# Patient Record
Sex: Female | Born: 1990 | Race: White | Hispanic: No | Marital: Married | Smoking: Former smoker
Health system: Southern US, Community
[De-identification: ages and names within clinical notes are randomized; demographics above are authoritative.]

## PROBLEM LIST (undated history)

## (undated) ENCOUNTER — Inpatient Hospital Stay: Payer: Self-pay

## (undated) DIAGNOSIS — R12 Heartburn: Secondary | ICD-10-CM

## (undated) DIAGNOSIS — F419 Anxiety disorder, unspecified: Secondary | ICD-10-CM

## (undated) HISTORY — PX: WISDOM TOOTH EXTRACTION: SHX21

## (undated) HISTORY — DX: Heartburn: R12

## (undated) HISTORY — DX: Anxiety disorder, unspecified: F41.9

---

## 2015-02-14 ENCOUNTER — Emergency Department: Payer: Self-pay | Admitting: Emergency Medicine

## 2015-03-17 ENCOUNTER — Emergency Department: Admit: 2015-03-17 | Disposition: A | Discharge status: Home / Self Care | Payer: Self-pay | Admitting: Family Medicine

## 2016-11-04 ENCOUNTER — Ambulatory Visit (INDEPENDENT_AMBULATORY_CARE_PROVIDER_SITE_OTHER): Payer: Medicaid Other | Admitting: Obstetrics and Gynecology

## 2016-11-04 ENCOUNTER — Other Ambulatory Visit: Payer: Self-pay | Admitting: Obstetrics and Gynecology

## 2016-11-04 ENCOUNTER — Ambulatory Visit (INDEPENDENT_AMBULATORY_CARE_PROVIDER_SITE_OTHER): Payer: Medicaid Other

## 2016-11-04 ENCOUNTER — Encounter: Payer: Self-pay | Admitting: Obstetrics and Gynecology

## 2016-11-04 VITALS — BP 132/89 | HR 88 | Ht 66.0 in | Wt 219.0 lb

## 2016-11-04 DIAGNOSIS — G43909 Migraine, unspecified, not intractable, without status migrainosus: Secondary | ICD-10-CM | POA: Insufficient documentation

## 2016-11-04 DIAGNOSIS — N926 Irregular menstruation, unspecified: Secondary | ICD-10-CM | POA: Insufficient documentation

## 2016-11-04 DIAGNOSIS — Z3402 Encounter for supervision of normal first pregnancy, second trimester: Secondary | ICD-10-CM | POA: Diagnosis not present

## 2016-11-04 DIAGNOSIS — Z369 Encounter for antenatal screening, unspecified: Secondary | ICD-10-CM | POA: Diagnosis not present

## 2016-11-04 DIAGNOSIS — J302 Other seasonal allergic rhinitis: Secondary | ICD-10-CM | POA: Insufficient documentation

## 2016-11-04 DIAGNOSIS — Z113 Encounter for screening for infections with a predominantly sexual mode of transmission: Secondary | ICD-10-CM

## 2016-11-04 DIAGNOSIS — Z8709 Personal history of other diseases of the respiratory system: Secondary | ICD-10-CM | POA: Insufficient documentation

## 2016-11-04 DIAGNOSIS — E669 Obesity, unspecified: Secondary | ICD-10-CM

## 2016-11-04 DIAGNOSIS — N912 Amenorrhea, unspecified: Secondary | ICD-10-CM | POA: Insufficient documentation

## 2016-11-04 DIAGNOSIS — Z1389 Encounter for screening for other disorder: Secondary | ICD-10-CM

## 2016-11-04 LAB — OB RESULTS CONSOLE VARICELLA ZOSTER ANTIBODY, IGG: VARICELLA IGG: IMMUNE

## 2016-11-04 NOTE — Progress Notes (Signed)
NEW OB HISTORY AND PHYSICAL  SUBJECTIVE:       Marcia AbbottRebecca Morris is a 25 y.o. G1P0 female, Patient's last menstrual period was 07/24/2016 (exact date)., Estimated Date of Delivery: 04/30/17, [id number], presents today for establishment of Prenatal Care. She has no unusual complaints and complains of headache and nausea without vomiting for intermittent days      Gynecologic History Patient's last menstrual period was 07/24/2016 (exact date). Abnormal Contraception: none Last Pap: 2011. Results were: normal  Obstetric History OB History  Gravida Para Term Preterm AB Living  1            SAB TAB Ectopic Multiple Live Births               # Outcome Date GA Lbr Len/2nd Weight Sex Delivery Anes PTL Lv  1 Current               History reviewed. No pertinent past medical history.  Past Surgical History:  Procedure Laterality Date  . WISDOM TOOTH EXTRACTION      No current outpatient prescriptions on file prior to visit.   No current facility-administered medications on file prior to visit.     No Known Allergies  Social History   Social History  . Marital status: Married    Spouse name: N/A  . Number of children: N/A  . Years of education: N/A   Occupational History  . Not on file.   Social History Main Topics  . Smoking status: Former Smoker    Quit date: 11/24/2014  . Smokeless tobacco: Never Used  . Alcohol use No  . Drug use: No  . Sexual activity: Yes    Partners: Male   Other Topics Concern  . Not on file   Social History Narrative  . No narrative on file    Family History  Problem Relation Age of Onset  . Hypertension Mother   . Cancer Mother     SKIN CANCER  . Hypertension Father   . Cancer Father     PROSTATE CANCER  . Seizures Brother   . Cancer Maternal Grandmother     BRAIN TUMOR    The following portions of the patient's history were reviewed and updated as appropriate: allergies, current medications, past OB history, past medical  history, past surgical history, past family history, past social history, and problem list.    OBJECTIVE: Initial Physical Exam (New OB)  GENERAL APPEARANCE: alert, well appearing, in no apparent distress, oriented to person, place and time, overweight HEAD: normocephalic, atraumatic MOUTH: mucous membranes moist, pharynx normal without lesions THYROID: no thyromegaly or masses present BREASTS: not examined LUNGS: clear to auscultation, no wheezes, rales or rhonchi, symmetric air entry HEART: regular rate and rhythm, no murmurs ABDOMEN: soft, nontender, nondistended, no abnormal masses, no epigastric pain, fundus soft, nontender 14 weeks size and FHT present EXTREMITIES: no redness or tenderness in the calves or thighs SKIN: normal coloration and turgor, no rashes LYMPH NODES: no adenopathy palpable NEUROLOGIC: alert, oriented, normal speech, no focal findings or movement disorder noted  PELVIC EXAM not indicated  ASSESSMENT: Normal pregnancy Elevated BMI Irregular LMP  PLAN: Prenatal care Viability and dating scan done today confirms EDD Declined flu vaccine See orders

## 2016-11-04 NOTE — Patient Instructions (Signed)
Second Trimester of Pregnancy The second trimester is from week 13 through week 28 (months 4 through 6). The second trimester is often a time when you feel your best. Your body has also adjusted to being pregnant, and you begin to feel better physically. Usually, morning sickness has lessened or quit completely, you may have more energy, and you may have an increase in appetite. The second trimester is also a time when the fetus is growing rapidly. At the end of the sixth month, the fetus is about 9 inches long and weighs about 1 pounds. You will likely begin to feel the baby move (quickening) between 18 and 20 weeks of the pregnancy. Body changes during your second trimester Your body continues to go through many changes during your second trimester. The changes vary from woman to woman.  Your weight will continue to increase. You will notice your lower abdomen bulging out.  You may begin to get stretch marks on your hips, abdomen, and breasts.  You may develop headaches that can be relieved by medicines. The medicines should be approved by your health care provider.  You may urinate more often because the fetus is pressing on your bladder.  You may develop or continue to have heartburn as a result of your pregnancy.  You may develop constipation because certain hormones are causing the muscles that push waste through your intestines to slow down.  You may develop hemorrhoids or swollen, bulging veins (varicose veins).  You may have back pain. This is caused by:  Weight gain.  Pregnancy hormones that are relaxing the joints in your pelvis.  A shift in weight and the muscles that support your balance.  Your breasts will continue to grow and they will continue to become tender.  Your gums may bleed and may be sensitive to brushing and flossing.  Dark spots or blotches (chloasma, mask of pregnancy) may develop on your face. This will likely fade after the baby is born.  A dark line  from your belly button to the pubic area (linea nigra) may appear. This will likely fade after the baby is born.  You may have changes in your hair. These can include thickening of your hair, rapid growth, and changes in texture. Some women also have hair loss during or after pregnancy, or hair that feels dry or thin. Your hair will most likely return to normal after your baby is born. What to expect at prenatal visits During a routine prenatal visit:  You will be weighed to make sure you and the fetus are growing normally.  Your blood pressure will be taken.  Your abdomen will be measured to track your baby's growth.  The fetal heartbeat will be listened to.  Any test results from the previous visit will be discussed. Your health care provider may ask you:  How you are feeling.  If you are feeling the baby move.  If you have had any abnormal symptoms, such as leaking fluid, bleeding, severe headaches, or abdominal cramping.  If you are using any tobacco products, including cigarettes, chewing tobacco, and electronic cigarettes.  If you have any questions. Other tests that may be performed during your second trimester include:  Blood tests that check for:  Low iron levels (anemia).  Gestational diabetes (between 24 and 28 weeks).  Rh antibodies. This is to check for a protein on red blood cells (Rh factor).  Urine tests to check for infections, diabetes, or protein in the urine.  An ultrasound to   confirm the proper growth and development of the baby.  An amniocentesis to check for possible genetic problems.  Fetal screens for spina bifida and Down syndrome.  HIV (human immunodeficiency virus) testing. Routine prenatal testing includes screening for HIV, unless you choose not to have this test. Follow these instructions at home: Eating and drinking  Continue to eat regular, healthy meals.  Avoid raw meat, uncooked cheese, cat litter boxes, and soil used by cats. These  carry germs that can cause birth defects in the baby.  Take your prenatal vitamins.  Take 1500-2000 mg of calcium daily starting at the 20th week of pregnancy until you deliver your baby.  If you develop constipation:  Take over-the-counter or prescription medicines.  Drink enough fluid to keep your urine clear or pale yellow.  Eat foods that are high in fiber, such as fresh fruits and vegetables, whole grains, and beans.  Limit foods that are high in fat and processed sugars, such as fried and sweet foods. Activity  Exercise only as directed by your health care provider. Experiencing uterine cramps is a good sign to stop exercising.  Avoid heavy lifting, wear low heel shoes, and practice good posture.  Wear your seat belt at all times when driving.  Rest with your legs elevated if you have leg cramps or low back pain.  Wear a good support bra for breast tenderness.  Do not use hot tubs, steam rooms, or saunas. Lifestyle  Avoid all smoking, herbs, alcohol, and unprescribed drugs. These chemicals affect the formation and growth of the baby.  Do not use any products that contain nicotine or tobacco, such as cigarettes and e-cigarettes. If you need help quitting, ask your health care provider.  A sexual relationship may be continued unless your health care provider directs you otherwise. General instructions  Follow your health care provider's instructions regarding medicine use. There are medicines that are either safe or unsafe to take during pregnancy.  Take warm sitz baths to soothe any pain or discomfort caused by hemorrhoids. Use hemorrhoid cream if your health care provider approves.  If you develop varicose veins, wear support hose. Elevate your feet for 15 minutes, 3-4 times a day. Limit salt in your diet.  Visit your dentist if you have not gone yet during your pregnancy. Use a soft toothbrush to brush your teeth and be gentle when you floss.  Keep all follow-up  prenatal visits as told by your health care provider. This is important. Contact a health care provider if:  You have dizziness.  You have mild pelvic cramps, pelvic pressure, or nagging pain in the abdominal area.  You have persistent nausea, vomiting, or diarrhea.  You have a bad smelling vaginal discharge.  You have pain with urination. Get help right away if:  You have a fever.  You are leaking fluid from your vagina.  You have spotting or bleeding from your vagina.  You have severe abdominal cramping or pain.  You have rapid weight gain or weight loss.  You have shortness of breath with chest pain.  You notice sudden or extreme swelling of your face, hands, ankles, feet, or legs.  You have not felt your baby move in over an hour.  You have severe headaches that do not go away with medicine.  You have vision changes. Summary  The second trimester is from week 13 through week 28 (months 4 through 6). It is also a time when the fetus is growing rapidly.  Your body goes   through many changes during pregnancy. The changes vary from woman to woman.  Avoid all smoking, herbs, alcohol, and unprescribed drugs. These chemicals affect the formation and growth your baby.  Do not use any tobacco products, such as cigarettes, chewing tobacco, and e-cigarettes. If you need help quitting, ask your health care provider.  Contact your health care provider if you have any questions. Keep all prenatal visits as told by your health care provider. This is important. This information is not intended to replace advice given to you by your health care provider. Make sure you discuss any questions you have with your health care provider. Document Released: 11/11/2001 Document Revised: 04/24/2016 Document Reviewed: 01/18/2013 Elsevier Interactive Patient Education  2017 [employer]  

## 2016-11-05 LAB — CBC WITH DIFFERENTIAL/PLATELET
BASOS: 0 %
Basophils Absolute: 0 10*3/uL (ref 0.0–0.2)
EOS (ABSOLUTE): 0.1 10*3/uL (ref 0.0–0.4)
EOS: 1 %
HEMATOCRIT: 37.5 % (ref 34.0–46.6)
HEMOGLOBIN: 12.7 g/dL (ref 11.1–15.9)
IMMATURE GRANULOCYTES: 0 %
Immature Grans (Abs): 0 10*3/uL (ref 0.0–0.1)
Lymphocytes Absolute: 2.6 10*3/uL (ref 0.7–3.1)
Lymphs: 28 %
MCH: 30 pg (ref 26.6–33.0)
MCHC: 33.9 g/dL (ref 31.5–35.7)
MCV: 89 fL (ref 79–97)
MONOCYTES: 7 %
Monocytes Absolute: 0.7 10*3/uL (ref 0.1–0.9)
NEUTROS PCT: 64 %
Neutrophils Absolute: 6 10*3/uL (ref 1.4–7.0)
Platelets: 246 10*3/uL (ref 150–379)
RBC: 4.23 x10E6/uL (ref 3.77–5.28)
RDW: 13.4 % (ref 12.3–15.4)
WBC: 9.5 10*3/uL (ref 3.4–10.8)

## 2016-11-05 LAB — VARICELLA ZOSTER ANTIBODY, IGG: Varicella zoster IgG: 818 index (ref >165)

## 2016-11-05 LAB — HEPATITIS B SURFACE ANTIGEN: HEP B S AG: NEGATIVE

## 2016-11-05 LAB — ABO

## 2016-11-05 LAB — HEMOGLOBIN A1C
ESTIMATED AVERAGE GLUCOSE: 97 mg/dL
Hgb A1c MFr Bld: 5 % (ref 4.8–5.6)

## 2016-11-05 LAB — RPR: RPR: NONREACTIVE

## 2016-11-05 LAB — HIV ANTIBODY (ROUTINE TESTING W REFLEX): HIV SCREEN 4TH GENERATION: NONREACTIVE

## 2016-11-05 LAB — TSH: TSH: 1.81 u[IU]/mL (ref 0.450–4.500)

## 2016-11-05 LAB — RH TYPE: Rh Factor: NEGATIVE

## 2016-11-05 LAB — RUBELLA SCREEN: Rubella Antibodies, IGG: 3.48 index (ref >0.99)

## 2016-11-05 LAB — SICKLE CELL SCREEN: SICKLE CELL SCREEN: NEGATIVE

## 2016-11-05 LAB — ANTIBODY SCREEN: Antibody Screen: NEGATIVE

## 2016-11-06 ENCOUNTER — Other Ambulatory Visit: Payer: Self-pay | Admitting: Obstetrics and Gynecology

## 2016-11-06 LAB — URINALYSIS, ROUTINE W REFLEX MICROSCOPIC
Bilirubin, UA: NEGATIVE
Glucose, UA: NEGATIVE
LEUKOCYTES UA: NEGATIVE
Nitrite, UA: NEGATIVE
RBC, UA: NEGATIVE
Urobilinogen, Ur: 0.2 mg/dL (ref 0.2–1.0)
pH, UA: 6.5 (ref 5.0–7.5)

## 2016-11-06 LAB — NICOTINE SCREEN, URINE: Cotinine Ql Scrn, Ur: NEGATIVE ng/mL

## 2016-11-06 LAB — MONITOR DRUG PROFILE 14(MW)
AMPHETAMINE SCREEN URINE: NEGATIVE ng/mL
BARBITURATE SCREEN URINE: NEGATIVE ng/mL
BENZODIAZEPINE SCREEN, URINE: NEGATIVE ng/mL
Buprenorphine, Urine: NEGATIVE ng/mL
CANNABINOIDS UR QL SCN: NEGATIVE ng/mL
Cocaine (Metab) Scrn, Ur: NEGATIVE ng/mL
Creatinine(Crt), U: 274.1 mg/dL (ref 20.0–300.0)
Fentanyl, Urine: NEGATIVE pg/mL
Meperidine Screen, Urine: NEGATIVE ng/mL
Methadone Screen, Urine: NEGATIVE ng/mL
OXYCODONE+OXYMORPHONE UR QL SCN: NEGATIVE ng/mL
Opiate Scrn, Ur: NEGATIVE ng/mL
PH UR, DRUG SCRN: 6.2 (ref 4.5–8.9)
PHENCYCLIDINE QUANTITATIVE URINE: NEGATIVE ng/mL
PROPOXYPHENE SCREEN URINE: NEGATIVE ng/mL
SPECIFIC GRAVITY: 1.03
Tramadol Screen, Urine: NEGATIVE ng/mL

## 2016-11-06 LAB — GC/CHLAMYDIA PROBE AMP
CHLAMYDIA, DNA PROBE: NEGATIVE
NEISSERIA GONORRHOEAE BY PCR: NEGATIVE

## 2016-11-06 MED ORDER — NITROFURANTOIN MONOHYD MACRO 100 MG PO CAPS: 100.0000 mg | ORAL_CAPSULE | Freq: Two times a day (BID) | ORAL | 0 refills | Status: DC

## 2016-11-07 ENCOUNTER — Telehealth: Payer: Self-pay | Admitting: *Deleted

## 2016-11-07 NOTE — Telephone Encounter (Signed)
-----   Message from Purcell NailsMelody N [place], [place] sent at 11/06/2016  5:36 PM EST ----- Please let her know she has a UTI, I sent in an antibiotic

## 2016-11-07 NOTE — Telephone Encounter (Signed)
Notified pt of results 

## 2016-11-08 LAB — URINE CULTURE, OB REFLEX

## 2016-11-08 LAB — CULTURE, OB URINE

## 2016-11-11 ENCOUNTER — Other Ambulatory Visit: Payer: Self-pay | Admitting: Obstetrics and Gynecology

## 2016-11-11 MED ORDER — FLUCONAZOLE 100 MG PO TABS: 150.0000 mg | ORAL_TABLET | ORAL | 1 refills | Status: AC

## 2016-12-01 NOTE — L&D Delivery Note (Signed)
Delivery Note  1115 In room to assess patient, pushing resumed at 1050. SVE: 10/100/+3, vertex. Effective maternal pushing efforts in side lying position.   1210 Spontaneous vaginal birth of live born female infant in vertex presentation in left occiput anterior position. Loose nuchal x 1 reduced via somersaulting technique to the inner right maternal thigh. Infant immediately to maternal abdomen. Receiving nurse at bedside for delivery. Delayed cord clamping. Cord blood obtained. APGARs: 8, 9. Weight: 6 pounds +15 ounces.   Spontaneous delivery of placenta at 1215. Bilateral labial lacerations and perineal skid mark repaired with 3-0 vicryl rapide under epidural and local anesthesia.   Uterus firm. EBL: 300 ml. Vault check completed. Counts correct x 2. Pitocin infusing.  Mom to postpartum.  Baby to Couplet care / Skin to Skin.  Initiate routine postpartum care and orders. Reassess as needed.   [name] 05/03/2017, 1:06 PM  Dr. Valentino Saxonherry present and readily available for delivery.

## 2016-12-05 ENCOUNTER — Other Ambulatory Visit: Payer: Self-pay | Admitting: Obstetrics and Gynecology

## 2016-12-05 DIAGNOSIS — Z369 Encounter for antenatal screening, unspecified: Secondary | ICD-10-CM

## 2016-12-09 ENCOUNTER — Ambulatory Visit (INDEPENDENT_AMBULATORY_CARE_PROVIDER_SITE_OTHER): Payer: Medicaid Other | Admitting: Obstetrics and Gynecology

## 2016-12-09 ENCOUNTER — Ambulatory Visit (INDEPENDENT_AMBULATORY_CARE_PROVIDER_SITE_OTHER): Payer: Medicaid Other

## 2016-12-09 VITALS — BP 140/84 | HR 92 | Wt 224.1 lb

## 2016-12-09 DIAGNOSIS — Z3402 Encounter for supervision of normal first pregnancy, second trimester: Secondary | ICD-10-CM

## 2016-12-09 DIAGNOSIS — Z369 Encounter for antenatal screening, unspecified: Secondary | ICD-10-CM

## 2016-12-09 DIAGNOSIS — Z3492 Encounter for supervision of normal pregnancy, unspecified, second trimester: Secondary | ICD-10-CM

## 2016-12-09 LAB — POCT URINALYSIS DIPSTICK
Bilirubin, UA: NEGATIVE
Blood, UA: NEGATIVE
Glucose, UA: NEGATIVE
KETONES UA: NEGATIVE
Leukocytes, UA: NEGATIVE
Nitrite, UA: NEGATIVE
PROTEIN UA: NEGATIVE
SPEC GRAV UA: 1.01
Urobilinogen, UA: 0.2
pH, UA: 6.5

## 2016-12-09 NOTE — Progress Notes (Signed)
ROB-anatomy scan done today, she is doing well 

## 2016-12-09 NOTE — Progress Notes (Signed)
ROB & Anatomy scan;discussed enrolling in classes;   Indications:Anatomy U/S Findings:  Singleton intrauterine pregnancy is visualized with FHR at 152 BPM. Biometrics give an (U/S) Gestational age of 27 5/7 weeks and an (U/S) EDD of 04/30/16; this correlates with the clinically established EDD of 04/30/17.  Fetal presentation is Vertex.  EFW: 296g (10 oz). Placenta: Anterior, grade 0, 6.2 cm from internal os AFI: Adequate with MVP of 4.8 cm..  Anatomic survey is incomplete and normal; Gender - female  . Spine, Ductal and Aortic arch is suboptimal due to fetal position.  Ovaries are not seen. Survey of the adnexa demonstrates no adnexal masses. There is no free peritoneal fluid in the cul de sac.  Impression: 1. 19 5/7 week Viable Singleton Intrauterine pregnancy by U/S. 2. (U/S) EDD is consistent with Clinically established (LMP) EDD of . 3. Spine, Ductal and Aortic arch is suboptimal due to fetal position.

## 2017-01-01 ENCOUNTER — Ambulatory Visit (INDEPENDENT_AMBULATORY_CARE_PROVIDER_SITE_OTHER): Payer: Medicaid Other | Admitting: Obstetrics and Gynecology

## 2017-01-01 ENCOUNTER — Ambulatory Visit (INDEPENDENT_AMBULATORY_CARE_PROVIDER_SITE_OTHER): Payer: Medicaid Other

## 2017-01-01 VITALS — BP 128/69 | HR 73 | Wt 223.5 lb

## 2017-01-01 DIAGNOSIS — Z3492 Encounter for supervision of normal pregnancy, unspecified, second trimester: Secondary | ICD-10-CM | POA: Diagnosis not present

## 2017-01-01 DIAGNOSIS — F439 Reaction to severe stress, unspecified: Secondary | ICD-10-CM

## 2017-01-01 LAB — POCT URINALYSIS DIPSTICK
Bilirubin, UA: NEGATIVE
Blood, UA: NEGATIVE
Glucose, UA: NEGATIVE
Ketones, UA: NEGATIVE
Leukocytes, UA: NEGATIVE
Nitrite, UA: NEGATIVE
Protein, UA: NEGATIVE
Spec Grav, UA: 1.015
Urobilinogen, UA: 0.2
pH, UA: 6.5

## 2017-01-01 NOTE — Progress Notes (Signed)
ROB- reviewed normal scan to complete anatomy scan, reports concerns about stress in pregnancy and possible effects. States increased stress at home due to spouses job and lack of pay, has went to work and reports financial stress, lack of support and communication with spouse, they didn't want children ever, and feelings of guilt associated with that. Easily aggitated and tearful.  Not exercising anymore and no known stress relief techniques.  Referred for counseling, recommend exercise and finding other forms of stress relief.

## 2017-01-01 NOTE — Progress Notes (Signed)
ROB- pt had follow-up anatomy scan done today, otherwise she is doing well

## 2017-01-05 ENCOUNTER — Encounter: Payer: Self-pay | Admitting: Obstetrics and Gynecology

## 2017-02-05 ENCOUNTER — Other Ambulatory Visit: Payer: Medicaid Other

## 2017-02-05 ENCOUNTER — Encounter: Payer: Medicaid Other | Admitting: Certified Nurse Midwife

## 2017-02-06 ENCOUNTER — Ambulatory Visit (INDEPENDENT_AMBULATORY_CARE_PROVIDER_SITE_OTHER): Payer: Medicaid Other | Admitting: Certified Nurse Midwife

## 2017-02-06 ENCOUNTER — Encounter: Payer: Self-pay | Admitting: Certified Nurse Midwife

## 2017-02-06 ENCOUNTER — Other Ambulatory Visit: Payer: Medicaid Other

## 2017-02-06 VITALS — BP 130/7 | HR 83 | Wt 226.2 lb

## 2017-02-06 DIAGNOSIS — Z3403 Encounter for supervision of normal first pregnancy, third trimester: Secondary | ICD-10-CM

## 2017-02-06 DIAGNOSIS — Z130 Encounter for screening for diseases of the blood and blood-forming organs and certain disorders involving the immune mechanism: Secondary | ICD-10-CM | POA: Diagnosis not present

## 2017-02-06 DIAGNOSIS — Z131 Encounter for screening for diabetes mellitus: Secondary | ICD-10-CM

## 2017-02-06 DIAGNOSIS — Z23 Encounter for immunization: Secondary | ICD-10-CM

## 2017-02-06 LAB — POCT URINALYSIS DIPSTICK
Bilirubin, UA: NEGATIVE
GLUCOSE UA: NEGATIVE
KETONES UA: NEGATIVE
Leukocytes, UA: NEGATIVE
Nitrite, UA: NEGATIVE
Protein, UA: NEGATIVE
RBC UA: NEGATIVE
SPEC GRAV UA: 1.02
UROBILINOGEN UA: NEGATIVE
pH, UA: 6.5

## 2017-02-06 MED ORDER — TETANUS-DIPHTH-ACELL PERTUSSIS 5-2.5-18.5 LF-MCG/0.5 IM SUSP
0.5000 mL | Freq: Once | INTRAMUSCULAR | Status: AC
  Administered 2017-02-06: 0.5 mL via INTRAMUSCULAR

## 2017-02-06 MED ORDER — RHO D IMMUNE GLOBULIN 1500 UNITS IM SOSY
1500.0000 [IU] | PREFILLED_SYRINGE | Freq: Once | INTRAMUSCULAR | Status: AC
  Administered 2017-02-06: 1500 [IU] via INTRAMUSCULAR

## 2017-02-06 NOTE — Progress Notes (Signed)
[name]-Pt reports gallbladder pain and reflux after eating greasy foods. Abdomen soft, gravid, non-tender. Advised bland diet and zantac daily. Childbirth classes start Monday. Answered questions about birth plans, delayed cord clamping and skin to skin. Encouraged home measures and abdominal support for treatment of round ligament pain. Blood consent signed. Desires mini pill postpartum for pregnancy prevention. Will check status of counseling referral and contact patient later today. RTC x 2 weeks for [name].

## 2017-02-06 NOTE — Progress Notes (Signed)
ROB- GTT, Tdap, Rhogam, and BTC done.

## 2017-02-06 NOTE — Patient Instructions (Addendum)
Third Trimester of Pregnancy The third trimester is from week 29 through week 42, months 7 through 9. This trimester is when your unborn baby (fetus) is growing very fast. At the end of the ninth month, the unborn baby is about 20 inches in length. It weighs about 6-10 pounds. Follow these instructions at home:  Avoid all smoking, herbs, and alcohol. Avoid drugs not approved by your doctor.  Do not use any tobacco products, including cigarettes, chewing tobacco, and electronic cigarettes. If you need help quitting, ask your doctor. You may get counseling or other support to help you quit.  Only take medicine as told by your doctor. Some medicines are safe and some are not during pregnancy.  Exercise only as told by your doctor. Stop exercising if you start having cramps.  Eat regular, healthy meals.  Wear a good support bra if your breasts are tender.  Do not use hot tubs, steam rooms, or saunas.  Wear your seat belt when driving.  Avoid raw meat, uncooked cheese, and liter boxes and soil used by cats.  Take your prenatal vitamins.  Take 1500-2000 milligrams of calcium daily starting at the 20th week of pregnancy until you deliver your baby.  Try taking medicine that helps you poop (stool softener) as needed, and if your doctor approves. Eat more fiber by eating fresh fruit, vegetables, and whole grains. Drink enough fluids to keep your pee (urine) clear or pale yellow.  Take warm water baths (sitz baths) to soothe pain or discomfort caused by hemorrhoids. Use hemorrhoid cream if your doctor approves.  If you have puffy, bulging veins (varicose veins), wear support hose. Raise (elevate) your feet for 15 minutes, 3-4 times a day. Limit salt in your diet.  Avoid heavy lifting, wear low heels, and sit up straight.  Rest with your legs raised if you have leg cramps or low back pain.  Visit your dentist if you have not gone during your pregnancy. Use a soft toothbrush to brush your  teeth. Be gentle when you floss.  You can have sex (intercourse) unless your doctor tells you not to.  Do not travel far distances unless you must. Only do so with your doctor's approval.  Take prenatal classes.  Practice driving to the hospital.  Pack your hospital bag.  Prepare the baby's room.  Go to your doctor visits. Get help if:  You are not sure if you are in labor or if your water has broken.  You are dizzy.  You have mild cramps or pressure in your lower belly (abdominal).  You have a nagging pain in your belly area.  You continue to feel sick to your stomach (nauseous), throw up (vomit), or have watery poop (diarrhea).  You have bad smelling fluid coming from your vagina.  You have pain with peeing (urination). Get help right away if:  You have a fever.  You are leaking fluid from your vagina.  You are spotting or bleeding from your vagina.  You have severe belly cramping or pain.  You lose or gain weight rapidly.  You have trouble catching your breath and have chest pain.  You notice sudden or extreme puffiness (swelling) of your face, hands, ankles, feet, or legs.  You have not felt the baby move in over an hour.  You have severe headaches that do not go away with medicine.  You have vision changes. This information is not intended to replace advice given to you by your health care provider. Make   sure you discuss any questions you have with your health care provider. Document Released: 02/11/2010 Document Revised: 04/24/2016 Document Reviewed: 01/18/2013 Elsevier Interactive Patient Education  2017 [employer]  Tdap Vaccine (Tetanus, Diphtheria and Pertussis): What You Need to Know 1. Why get vaccinated? Tetanus, diphtheria and pertussis are very serious diseases. Tdap vaccine can protect us from these diseases. And, Tdap vaccine given to pregnant women can protect newborn babies against pertussis. TETANUS (Lockjaw) is rare in the [place] today. It causes painful muscle tightening and stiffness, usually all over the body.  It can lead to tightening of muscles in the head and neck so you can't open your mouth, swallow, or sometimes even breathe. Tetanus kills about 1 out of 10 people who are infected even after receiving the best medical care.  DIPHTHERIA is also rare in the [place] today. It can cause a thick coating to form in the back of the throat.  It can lead to breathing problems, heart failure, paralysis, and death.  PERTUSSIS (Whooping Cough) causes severe coughing spells, which can cause difficulty breathing, vomiting and disturbed sleep.  It can also lead to weight loss, incontinence, and rib fractures. Up to 2 in 100 adolescents and 5 in 100 adults with pertussis are hospitalized or have complications, which could include pneumonia or death.  These diseases are caused by bacteria. Diphtheria and pertussis are spread from person to person through secretions from coughing or sneezing. Tetanus enters the body through cuts, scratches, or wounds. Before vaccines, as many as 200,000 cases of diphtheria, 200,000 cases of pertussis, and hundreds of cases of tetanus, were reported in the [place] each year. Since vaccination began, reports of cases for tetanus and diphtheria have dropped by about 99% and for pertussis by about 80%. 2. Tdap vaccine Tdap vaccine can protect adolescents and adults from tetanus, diphtheria, and pertussis. One dose of Tdap is routinely given at age 11 or 12. People who did not get Tdap at that age should get it as soon as possible. Tdap is especially important for healthcare professionals and anyone having close contact with a baby younger than 12 months. Pregnant women should get a dose of Tdap during every pregnancy, to protect the newborn from pertussis. Infants are most at risk for severe, life-threatening complications from pertussis. Another vaccine, called Td, protects against  tetanus and diphtheria, but not pertussis. A Td booster should be given every 10 years. Tdap may be given as one of these boosters if you have never gotten Tdap before. Tdap may also be given after a severe cut or burn to prevent tetanus infection. Your doctor or the person giving you the vaccine can give you more information. Tdap may safely be given at the same time as other vaccines. 3. Some people should not get this vaccine  A person who has ever had a life-threatening allergic reaction after a previous dose of any diphtheria, tetanus or pertussis containing vaccine, OR has a severe allergy to any part of this vaccine, should not get Tdap vaccine. Tell the person giving the vaccine about any severe allergies.  Anyone who had coma or long repeated seizures within 7 days after a childhood dose of DTP or DTaP, or a previous dose of Tdap, should not get Tdap, unless a cause other than the vaccine was found. They can still get Td.  Talk to your doctor if you: ? have seizures or another nervous system problem, ? had severe pain or swelling after any vaccine   containing diphtheria, tetanus or pertussis, ? ever had a condition called Guillain-Barr Syndrome (GBS), ? aren't feeling well on the day the shot is scheduled. 4. Risks With any medicine, including vaccines, there is a chance of side effects. These are usually mild and go away on their own. Serious reactions are also possible but are rare. Most people who get Tdap vaccine do not have any problems with it. Mild problems following Tdap: (Did not interfere with activities)  Pain where the shot was given (about 3 in 4 adolescents or 2 in 3 adults)  Redness or swelling where the shot was given (about 1 person in 5)  Mild fever of at least 100.4F (up to about 1 in 25 adolescents or 1 in 100 adults)  Headache (about 3 or 4 people in 10)  Tiredness (about 1 person in 3 or 4)  Nausea, vomiting, diarrhea, stomach ache (up to 1 in 4  adolescents or 1 in 10 adults)  Chills, sore joints (about 1 person in 10)  Body aches (about 1 person in 3 or 4)  Rash, swollen glands (uncommon)  Moderate problems following Tdap: (Interfered with activities, but did not require medical attention)  Pain where the shot was given (up to 1 in 5 or 6)  Redness or swelling where the shot was given (up to about 1 in 16 adolescents or 1 in 12 adults)  Fever over 102F (about 1 in 100 adolescents or 1 in 250 adults)  Headache (about 1 in 7 adolescents or 1 in 10 adults)  Nausea, vomiting, diarrhea, stomach ache (up to 1 or 3 people in 100)  Swelling of the entire arm where the shot was given (up to about 1 in 500).  Severe problems following Tdap: (Unable to perform usual activities; required medical attention)  Swelling, severe pain, bleeding and redness in the arm where the shot was given (rare).  Problems that could happen after any vaccine:  People sometimes faint after a medical procedure, including vaccination. Sitting or lying down for about 15 minutes can help prevent fainting, and injuries caused by a fall. Tell your doctor if you feel dizzy, or have vision changes or ringing in the ears.  Some people get severe pain in the shoulder and have difficulty moving the arm where a shot was given. This happens very rarely.  Any medication can cause a severe allergic reaction. Such reactions from a vaccine are very rare, estimated at fewer than 1 in a million doses, and would happen within a few minutes to a few hours after the vaccination. As with any medicine, there is a very remote chance of a vaccine causing a serious injury or death. The safety of vaccines is always being monitored. For more information, visit: www.cdc.gov/vaccinesafety/ 5. What if there is a serious problem? What should I look for? Look for anything that concerns you, such as signs of a severe allergic reaction, very high fever, or unusual behavior. Signs of  a severe allergic reaction can include hives, swelling of the face and throat, difficulty breathing, a fast heartbeat, dizziness, and weakness. These would usually start a few minutes to a few hours after the vaccination. What should I do?  If you think it is a severe allergic reaction or other emergency that can't wait, call 9-1-1 or get the person to the nearest hospital. Otherwise, call your doctor.  Afterward, the reaction should be reported to the Vaccine Adverse Event Reporting System (VAERS). Your doctor might file this report, or you   can do it yourself through the VAERS web site at www.vaers.hhs.gov, or by calling 1-800-822-7967. ? VAERS does not give medical advice. 6. The National Vaccine Injury Compensation Program The National Vaccine Injury Compensation Program (VICP) is a federal program that was created to compensate people who may have been injured by certain vaccines. Persons who believe they may have been injured by a vaccine can learn about the program and about filing a claim by calling 1-800-338-2382 or visiting the VICP website at www.hrsa.gov/vaccinecompensation. There is a time limit to file a claim for compensation. 7. How can I learn more?  Ask your doctor. He or she can give you the vaccine package insert or suggest other sources of information.  Call your local or state health department.  Contact the Centers for Disease Control and Prevention (CDC): ? Call 1-800-232-4636 (1-800-CDC-INFO) or ? Visit CDC's website at www.cdc.gov/vaccines CDC Tdap Vaccine VIS (01/24/14) This information is not intended to replace advice given to you by your health care provider. Make sure you discuss any questions you have with your health care provider. Document Released: 05/18/2012 Document Revised: 08/07/2016 Document Reviewed: 08/07/2016 Elsevier Interactive Patient Education  2017 Elsevier Inc.  

## 2017-02-07 LAB — HEMOGLOBIN AND HEMATOCRIT, BLOOD
HEMOGLOBIN: 12.5 g/dL (ref 11.1–15.9)
Hematocrit: 36.1 % (ref 34.0–46.6)

## 2017-02-07 LAB — GLUCOSE, 1 HOUR GESTATIONAL: Gestational Diabetes Screen: 114 mg/dL (ref 65–139)

## 2017-02-10 NOTE — Progress Notes (Signed)
Would you please contact the patient and let her know that her one (1) and H/H were normal. Also, would you remind her to set up her MyChart. Thanks, JML

## 2017-02-11 ENCOUNTER — Telehealth: Payer: Self-pay

## 2017-02-11 NOTE — Telephone Encounter (Signed)
Pt sent email notification for mychart. We don't have her ss# so pt told to put in [phone] as in chart.

## 2017-02-14 ENCOUNTER — Encounter: Payer: Self-pay | Admitting: Certified Nurse Midwife

## 2017-02-17 ENCOUNTER — Encounter: Payer: Self-pay | Admitting: Certified Nurse Midwife

## 2017-02-24 ENCOUNTER — Ambulatory Visit (INDEPENDENT_AMBULATORY_CARE_PROVIDER_SITE_OTHER): Payer: Medicaid Other | Admitting: Certified Nurse Midwife

## 2017-02-24 ENCOUNTER — Encounter: Payer: Self-pay | Admitting: Certified Nurse Midwife

## 2017-02-24 VITALS — BP 123/70 | HR 70 | Wt 224.7 lb

## 2017-02-24 DIAGNOSIS — Z3403 Encounter for supervision of normal first pregnancy, third trimester: Secondary | ICD-10-CM

## 2017-02-24 LAB — POCT URINALYSIS DIPSTICK
BILIRUBIN UA: NEGATIVE
Glucose, UA: NEGATIVE
KETONES UA: NEGATIVE
Leukocytes, UA: NEGATIVE
Nitrite, UA: NEGATIVE
Protein, UA: NEGATIVE
RBC UA: NEGATIVE
Spec Grav, UA: 1.01 (ref 1.030–1.035)
Urobilinogen, UA: NEGATIVE (ref ?–2.0)
pH, UA: 7 (ref 5.0–8.0)

## 2017-02-24 NOTE — Progress Notes (Signed)
[name], no complaints. She states that she has a few braxton hicks contractions that have been a tightening. She denies LOF, VB, and contractions. PTL precautions reviewed. She is taking the birthing classes and has decided that she would like an epidural for pain management during labor. [name] 2 wks.   Doreene BurkeAnnie Amberleigh Gerken, CNM

## 2017-02-24 NOTE — Patient Instructions (Signed)

## 2017-03-12 ENCOUNTER — Encounter: Payer: Self-pay | Admitting: Certified Nurse Midwife

## 2017-03-12 ENCOUNTER — Ambulatory Visit (INDEPENDENT_AMBULATORY_CARE_PROVIDER_SITE_OTHER): Payer: Medicaid Other | Admitting: Certified Nurse Midwife

## 2017-03-12 VITALS — BP 122/72 | HR 60 | Wt 221.6 lb

## 2017-03-12 DIAGNOSIS — Z3403 Encounter for supervision of normal first pregnancy, third trimester: Secondary | ICD-10-CM

## 2017-03-12 LAB — POCT URINALYSIS DIPSTICK
BILIRUBIN UA: NEGATIVE
Glucose, UA: NEGATIVE
KETONES UA: NEGATIVE
Leukocytes, UA: NEGATIVE
NITRITE UA: NEGATIVE
PH UA: 6.5 (ref 5.0–8.0)
Protein, UA: NEGATIVE
RBC UA: NEGATIVE
SPEC GRAV UA: 1.01 (ref 1.010–1.025)
Urobilinogen, UA: NEGATIVE E.U./dL — AB

## 2017-03-12 NOTE — Patient Instructions (Signed)

## 2017-03-15 NOTE — Progress Notes (Signed)
ROB-Pt doing well. Completed Childbirth classes and wants an epidural. Moved this week from an apartment to a new townhome. Her first baby shower is this weekend. Reviewed red flag symptoms and when to call. RTC x 3 weeks for ROB and 36 week cultures.

## 2017-03-29 ENCOUNTER — Observation Stay
Admission: EM | Admit: 2017-03-29 | Discharge: 2017-03-30 | Disposition: A | Discharge status: Home / Self Care | Payer: Medicaid Other | Attending: Obstetrics and Gynecology | Admitting: Obstetrics and Gynecology

## 2017-03-29 DIAGNOSIS — Z3490 Encounter for supervision of normal pregnancy, unspecified, unspecified trimester: Secondary | ICD-10-CM

## 2017-03-29 DIAGNOSIS — Z3A35 35 weeks gestation of pregnancy: Secondary | ICD-10-CM | POA: Insufficient documentation

## 2017-03-29 DIAGNOSIS — R1030 Lower abdominal pain, unspecified: Secondary | ICD-10-CM | POA: Insufficient documentation

## 2017-03-29 DIAGNOSIS — O26893 Other specified pregnancy related conditions, third trimester: Principal | ICD-10-CM | POA: Insufficient documentation

## 2017-03-30 DIAGNOSIS — R109 Unspecified abdominal pain: Secondary | ICD-10-CM | POA: Diagnosis not present

## 2017-03-30 DIAGNOSIS — R1030 Lower abdominal pain, unspecified: Secondary | ICD-10-CM | POA: Diagnosis not present

## 2017-03-30 DIAGNOSIS — Z3490 Encounter for supervision of normal pregnancy, unspecified, unspecified trimester: Secondary | ICD-10-CM

## 2017-03-30 DIAGNOSIS — Z3A35 35 weeks gestation of pregnancy: Secondary | ICD-10-CM

## 2017-03-30 DIAGNOSIS — O26893 Other specified pregnancy related conditions, third trimester: Secondary | ICD-10-CM

## 2017-03-30 NOTE — Discharge Summary (Signed)
Patient discharged with instructions on follow up appointment, oral hydration, labor precautions, and when to seek medical attention. Patient was ambulatory at discharge with steady gait and discharged with her husband. Patient verbalized understanding of discharge instructions given.

## 2017-03-30 NOTE — Progress Notes (Signed)
   L&D OB Triage Note  SUBJECTIVE [name] is a 26 y.o. G1P0 female at [redacted]w[redacted]d, EDD Estimated Date of Delivery: 04/30/17 who presented to triage with complaints of lower abdominal pain that started at 2100.   Obstetric History   G1   P0   T0   P0   A0   L0    SAB0   TAB0   Ectopic0   Multiple0   Live Births0     # Outcome Date GA Lbr Len/2nd Weight Sex Delivery Anes PTL Lv  1 Current               No prescriptions prior to admission.     OBJECTIVE  Nursing Evaluation:   BP 133/75 (BP Location: Left Arm)   Pulse 89   Temp 97.5 F (36.4 C) (Oral)   Resp 18   Ht  (1.676 m)   Wt 222 lb (100.7 kg)   LMP 07/24/2016 (Exact Date)   BMI 35.83 kg/m     NST was performed and has been reviewed by me.  NST INTERPRETATION: Category I  Mode: External Baseline Rate (A): 145 bpm (FHR) Variability: Moderate Accelerations: 15 x 15 Decelerations: None     Contraction Frequency (min): 2 irritability  ASSESSMENT Impression:  1. Pregnancy:  G1P0 at [redacted]w[redacted]d , EDD Estimated Date of Delivery: 04/30/17 2.  Reactive 3. Cervix finger tip, thick, ballotable ( per RN exam).   PLAN 1. Reassurance given 2. Discharge home with precautions to return to L&D or call the office if:  increased leakage or fluid, decreased fetal movement, bleeding from vaginal area or contractions that are more than 6 hrs,  increasing in strenght.   3. Continue routine prenatal care, follow up at next scheduled appointment.   Doreene Burke, CNM

## 2017-03-30 NOTE — OB Triage Note (Signed)
Patient came in for observation for lower abdominal pain that started tonight at 2100.Patient rates pain 5 out of 10. Patient denies leaking of fluid, vaginal bleeding and spotting. Patient states she has been feeling the baby move fine. Vital signs stable and patient afebrile. FHR baseline 145 with moderate variability with accelerations 15 x 15 and no decelerations. Husband at bedside. Will continue to monitor.

## 2017-04-03 ENCOUNTER — Encounter: Payer: Self-pay | Admitting: Certified Nurse Midwife

## 2017-04-03 ENCOUNTER — Ambulatory Visit (INDEPENDENT_AMBULATORY_CARE_PROVIDER_SITE_OTHER): Payer: Medicaid Other | Admitting: Certified Nurse Midwife

## 2017-04-03 VITALS — BP 121/78 | HR 69 | Wt 226.8 lb

## 2017-04-03 DIAGNOSIS — Z3685 Encounter for antenatal screening for Streptococcus B: Secondary | ICD-10-CM

## 2017-04-03 DIAGNOSIS — Z3689 Encounter for other specified antenatal screening: Secondary | ICD-10-CM

## 2017-04-03 DIAGNOSIS — Z202 Contact with and (suspected) exposure to infections with a predominantly sexual mode of transmission: Secondary | ICD-10-CM

## 2017-04-03 DIAGNOSIS — Z3403 Encounter for supervision of normal first pregnancy, third trimester: Secondary | ICD-10-CM

## 2017-04-03 LAB — POCT URINALYSIS DIPSTICK
BILIRUBIN UA: NEGATIVE
Blood, UA: NEGATIVE
Glucose, UA: NEGATIVE
KETONES UA: NEGATIVE
LEUKOCYTES UA: NEGATIVE
Nitrite, UA: NEGATIVE
SPEC GRAV UA: 1.015 (ref 1.010–1.025)
Urobilinogen, UA: NEGATIVE E.U./dL — AB
pH, UA: 7 (ref 5.0–8.0)

## 2017-04-03 NOTE — Progress Notes (Signed)
ROB and cultures today. Seen in ED last week- for ctx and pain.-

## 2017-04-03 NOTE — Patient Instructions (Signed)

## 2017-04-05 LAB — STREP GP B NAA: Strep Gp B NAA: POSITIVE — AB

## 2017-04-05 NOTE — Progress Notes (Signed)
ROB-Pt doing well, seen in ED last week for contractions and back pain. 36 week cultures collected, pt declined cervical exam. Unable to verify fetal position via Leopold's with obtain US for positioning. Reviewed red flag symptoms and when to call. RTC x 1 weeks for ROB/US

## 2017-04-06 LAB — GC/CHLAMYDIA PROBE AMP
Chlamydia trachomatis, NAA: NEGATIVE
Neisseria gonorrhoeae by PCR: NEGATIVE

## 2017-04-10 ENCOUNTER — Ambulatory Visit (INDEPENDENT_AMBULATORY_CARE_PROVIDER_SITE_OTHER): Payer: Medicaid Other

## 2017-04-10 ENCOUNTER — Ambulatory Visit (INDEPENDENT_AMBULATORY_CARE_PROVIDER_SITE_OTHER): Payer: Medicaid Other | Admitting: Certified Nurse Midwife

## 2017-04-10 ENCOUNTER — Encounter: Payer: Self-pay | Admitting: Certified Nurse Midwife

## 2017-04-10 VITALS — BP 112/55 | HR 76 | Wt 225.3 lb

## 2017-04-10 DIAGNOSIS — Z3403 Encounter for supervision of normal first pregnancy, third trimester: Secondary | ICD-10-CM

## 2017-04-10 DIAGNOSIS — Z3689 Encounter for other specified antenatal screening: Secondary | ICD-10-CM

## 2017-04-10 DIAGNOSIS — Z3483 Encounter for supervision of other normal pregnancy, third trimester: Secondary | ICD-10-CM

## 2017-04-10 LAB — POCT URINALYSIS DIPSTICK
BILIRUBIN UA: NEGATIVE
Glucose, UA: NEGATIVE
Ketones, UA: NEGATIVE
Nitrite, UA: NEGATIVE
PH UA: 7 (ref 5.0–8.0)
RBC UA: NEGATIVE
SPEC GRAV UA: 1.01 (ref 1.010–1.025)
UROBILINOGEN UA: NORMAL

## 2017-04-10 NOTE — Progress Notes (Signed)
Rob and u/s for presentation. NO complaints. Declines cervix check. GBS +

## 2017-04-10 NOTE — Progress Notes (Signed)
[name]-Pt doing well, no questions or concerns. Ultrasound findings reviewed with pt, Grade 3 placenta and fetal presentation discussed. Advised twice daily kick counts. Encouraged "[email or web address]" and optimization of fetal positioning techniques. Reviewed red flag symptoms and when to call. RTC x 1 week for NST w/[name].   ULTRASOUND REPORT  Location: [facility] Date of Service:   Indications: Presentation, Growth and AFI Findings:  Singleton intrauterine pregnancy is visualized with FHR at 140 BPM. Biometrics give an (U/S) Gestational age of [redacted] weeks 6 days, and an (U/S) EDD of 05/02/17; this correlates with the clinically established EDD of 04/30/17.  Fetal presentation is vertex, spine posterior.  EFW: 3073 grams ( 6 lbs. 12 oz) 48th percentile, Williams. Placenta: Anterior, grade 3 with calcifications noted AFI: Adequate at 13.4 cm.  Anatomic survey of the fetal stomach, bladder, kidneys and lateral ventricle appear WNL. Gender - Female.    Impression: 1. Fetal presentation is vertex with spine posterior. 2. EFW: 3073 grams ( 6 lbs. 12 oz.) 48th percentile for growth, Williams 3. AFI is adequate at 13.4 cm.

## 2017-04-10 NOTE — Patient Instructions (Signed)
Fetal Movement Counts  Patient Name: ________________________________________________ Patient Due Date: ____________________  What is a fetal movement count?  A fetal movement count is the number of times that you feel your baby move during a certain amount of time. This may also be called a fetal kick count. A fetal movement count is recommended for every pregnant woman. You may be asked to start counting fetal movements as early as week 28 of your pregnancy.  Pay attention to when your baby is most active. You may notice your baby's sleep and wake cycles. You may also notice things that make your baby move more. You should do a fetal movement count:  · When your baby is normally most active.  · At the same time each day.    A good time to count movements is while you are resting, after having something to eat and drink.  How do I count fetal movements?  1. Find a quiet, comfortable area. Sit, or lie down on your side.  2. Write down the date, the start time and stop time, and the number of movements that you felt between those two times. Take this information with you to your health care visits.  3. For 2 hours, count kicks, flutters, swishes, rolls, and jabs. You should feel at least 10 movements during 2 hours.  4. You may stop counting after you have felt 10 movements.  5. If you do not feel 10 movements in 2 hours, have something to eat and drink. Then, keep resting and counting for 1 hour. If you feel at least 4 movements during that hour, you may stop counting.  Contact a health care provider if:  · You feel fewer than 4 movements in 2 hours.  · Your baby is not moving like he or she usually does.  Date: ____________ Start time: ____________ Stop time: ____________ Movements: ____________  Date: ____________ Start time: ____________ Stop time: ____________ Movements: ____________  Date: ____________ Start time: ____________ Stop time: ____________ Movements: ____________  Date: ____________ Start time:  ____________ Stop time: ____________ Movements: ____________  Date: ____________ Start time: ____________ Stop time: ____________ Movements: ____________  Date: ____________ Start time: ____________ Stop time: ____________ Movements: ____________  Date: ____________ Start time: ____________ Stop time: ____________ Movements: ____________  Date: ____________ Start time: ____________ Stop time: ____________ Movements: ____________  Date: ____________ Start time: ____________ Stop time: ____________ Movements: ____________  This information is not intended to replace advice given to you by your health care provider. Make sure you discuss any questions you have with your health care provider.  Document Released: 12/17/2006 Document Revised: 07/16/2016 Document Reviewed: 12/27/2015  Elsevier Interactive Patient Education © 2017 [employer].

## 2017-04-13 ENCOUNTER — Encounter: Payer: Medicaid Other | Admitting: Certified Nurse Midwife

## 2017-04-13 ENCOUNTER — Other Ambulatory Visit: Payer: Medicaid Other

## 2017-04-17 ENCOUNTER — Other Ambulatory Visit: Payer: Medicaid Other | Admitting: Certified Nurse Midwife

## 2017-04-17 ENCOUNTER — Other Ambulatory Visit: Payer: Medicaid Other

## 2017-04-17 ENCOUNTER — Encounter: Payer: Self-pay | Admitting: Certified Nurse Midwife

## 2017-04-17 ENCOUNTER — Encounter: Payer: Medicaid Other | Admitting: Certified Nurse Midwife

## 2017-04-17 ENCOUNTER — Ambulatory Visit (INDEPENDENT_AMBULATORY_CARE_PROVIDER_SITE_OTHER): Payer: Medicaid Other | Admitting: Certified Nurse Midwife

## 2017-04-17 VITALS — BP 121/66 | HR 74 | Wt 224.3 lb

## 2017-04-17 DIAGNOSIS — Z3483 Encounter for supervision of other normal pregnancy, third trimester: Secondary | ICD-10-CM

## 2017-04-17 LAB — POCT URINALYSIS DIPSTICK
BILIRUBIN UA: NEGATIVE
Blood, UA: NEGATIVE
Glucose, UA: NEGATIVE
Ketones, UA: NEGATIVE
LEUKOCYTES UA: NEGATIVE
NITRITE UA: NEGATIVE
PH UA: 7 (ref 5.0–8.0)
Protein, UA: NEGATIVE
Spec Grav, UA: 1.01 (ref 1.010–1.025)
Urobilinogen, UA: NEGATIVE E.U./dL — AB

## 2017-04-17 NOTE — Patient Instructions (Signed)
Vaginal Delivery Vaginal delivery means that you will give birth by pushing your baby out of your birth canal (vagina). A team of health care providers will help you before, during, and after vaginal delivery. Birth experiences are unique for every woman and every pregnancy, and birth experiences vary depending on where you choose to give birth. What should I do to prepare for my baby's birth? Before your baby is born, it is important to talk with your health care provider about:  Your labor and delivery preferences. These may include:  Medicines that you may be given.  How you will manage your pain. This might include non-medical pain relief techniques or injectable pain relief such as epidural analgesia.  How you and your baby will be monitored during labor and delivery.  Who may be in the labor and delivery room with you.  Your feelings about surgical delivery of your baby (cesarean delivery, or C-section) if this becomes necessary.  Your feelings about receiving donated blood through an IV tube (blood transfusion) if this becomes necessary.  Whether you are able:  To take pictures or videos of the birth.  To eat during labor and delivery.  To move around, walk, or change positions during labor and delivery.  What to expect after your baby is born, such as:  Whether delayed umbilical cord clamping and cutting is offered.  Who will care for your baby right after birth.  Medicines or tests that may be recommended for your baby.  Whether breastfeeding is supported in your hospital or birth center.  How long you will be in the hospital or birth center.  How any medical conditions you have may affect your baby or your labor and delivery experience. To prepare for your baby's birth, you should also:  Attend all of your health care visits before delivery (prenatal visits) as recommended by your health care provider. This is important.  Prepare your home for your baby's  arrival. Make sure that you have:  Diapers.  Baby clothing.  Feeding equipment.  Safe sleeping arrangements for you and your baby.  Install a car seat in your vehicle. Have your car seat checked by a certified car seat installer to make sure that it is installed safely.  Think about who will help you with your new baby at home for at least the first several weeks after delivery. What can I expect when I arrive at the birth center or hospital? Once you are in labor and have been admitted into the hospital or birth center, your health care provider may:  Review your pregnancy history and any concerns you have.  Insert an IV tube into one of your veins. This is used to give you fluids and medicines.  Check your blood pressure, pulse, temperature, and heart rate (vital signs).  Check whether your bag of water (amniotic sac) has broken (ruptured).  Talk with you about your birth plan and discuss pain control options. Monitoring  Your health care provider may monitor your contractions (uterine monitoring) and your baby's heart rate (fetal monitoring). You may need to be monitored:  Often, but not continuously (intermittently).  All the time or for long periods at a time (continuously). Continuous monitoring may be needed if:  You are taking certain medicines, such as medicine to relieve pain or make your contractions stronger.  You have pregnancy or labor complications. Monitoring may be done by:  Placing a special stethoscope or a handheld monitoring device on your abdomen to check your baby's   heartbeat, and feeling your abdomen for contractions. This method of monitoring does not continuously record your baby's heartbeat or your contractions.  Placing monitors on your abdomen (external monitors) to record your baby's heartbeat and the frequency and length of contractions. You may not have to wear external monitors all the time.  Placing monitors inside of your uterus (internal  monitors) to record your baby's heartbeat and the frequency, length, and strength of your contractions.  Your health care provider may use internal monitors if he or she needs more information about the strength of your contractions or your baby's heart rate.  Internal monitors are put in place by passing a thin, flexible wire through your vagina and into your uterus. Depending on the type of monitor, it may remain in your uterus or on your baby's head until birth.  Your health care provider will discuss the benefits and risks of internal monitoring with you and will ask for your permission before inserting the monitors.  Telemetry. This is a type of continuous monitoring that can be done with external or internal monitors. Instead of having to stay in bed, you are able to move around during telemetry. Ask your health care provider if telemetry is an option for you. Physical exam  Your health care provider may perform a physical exam. This may include:  Checking whether your baby is positioned:  With the head toward your vagina (head-down). This is most common.  With the head toward the top of your uterus (head-up or breech). If your baby is in a breech position, your health care provider may try to turn your baby to a head-down position so you can deliver vaginally. If it does not seem that your baby can be born vaginally, your provider may recommend surgery to deliver your baby. In rare cases, you may be able to deliver vaginally if your baby is head-up (breech delivery).  Lying sideways (transverse). Babies that are lying sideways cannot be delivered vaginally.  Checking your cervix to determine:  Whether it is thinning out (effacing).  Whether it is opening up (dilating).  How low your baby has moved into your birth canal. What are the three stages of labor and delivery?   Normal labor and delivery is divided into the following three stages: Stage 1   Stage 1 is the longest stage  of labor, and it can last for hours or days. Stage 1 includes:  Early labor. This is when contractions may be irregular, or regular and mild. Generally, early labor contractions are more than 10 minutes apart.  Active labor. This is when contractions get longer, more regular, more frequent, and more intense.  The transition phase. This is when contractions happen very close together, are very intense, and may last longer than during any other part of labor.  Contractions generally feel mild, infrequent, and irregular at first. They get stronger, more frequent (about every 2-3 minutes), and more regular as you progress from early labor through active labor and transition.  Many women progress through stage 1 naturally, but you may need help to continue making progress. If this happens, your health care provider may talk with you about:  Rupturing your amniotic sac if it has not ruptured yet.  Giving you medicine to help make your contractions stronger and more frequent.  Stage 1 ends when your cervix is completely dilated to 4 inches (10 cm) and completely effaced. This happens at the end of the transition phase. Stage 2   Once your   cervix is completely effaced and dilated to 4 inches (10 cm), you may start to feel an urge to push. It is common for the body to naturally take a rest before feeling the urge to push, especially if you received an epidural or certain other pain medicines. This rest period may last for up to 1-2 hours, depending on your unique labor experience.  During stage 2, contractions are generally less painful, because pushing helps relieve contraction pain. Instead of contraction pain, you may feel stretching and burning pain, especially when the widest part of your baby's head passes through the vaginal opening (crowning).  Your health care provider will closely monitor your pushing progress and your baby's progress through the vagina during stage 2.  Your health care  provider may massage the area of skin between your vaginal opening and anus (perineum) or apply warm compresses to your perineum. This helps it stretch as the baby's head starts to crown, which can help prevent perineal tearing.  In some cases, an incision may be made in your perineum (episiotomy) to allow the baby to pass through the vaginal opening. An episiotomy helps to make the opening of the vagina larger to allow more room for the baby to fit through.  It is very important to breathe and focus so your health care provider can control the delivery of your baby's head. Your health care provider may have you decrease the intensity of your pushing, to help prevent perineal tearing.  After delivery of your baby's head, the shoulders and the rest of the body generally deliver very quickly and without difficulty.  Once your baby is delivered, the umbilical cord may be cut right away, or this may be delayed for 1-2 minutes, depending on your baby's health. This may vary among health care providers, hospitals, and birth centers.  If you and your baby are healthy enough, your baby may be placed on your chest or abdomen to help maintain the baby's temperature and to help you bond with each other. Some mothers and babies start breastfeeding at this time. Your health care team will dry your baby and help keep your baby warm during this time.  Your baby may need immediate care if he or she:  Showed signs of distress during labor.  Has a medical condition.  Was born too early (prematurely).  Had a bowel movement before birth (meconium).  Shows signs of difficulty transitioning from being inside the uterus to being outside of the uterus. If you are planning to breastfeed, your health care team will help you begin a feeding. Stage 3   The third stage of labor starts immediately after the birth of your baby and ends after you deliver the placenta. The placenta is an organ that develops during  pregnancy to provide oxygen and nutrients to your baby in the womb.  Delivering the placenta may require some pushing, and you may have mild contractions. Breastfeeding can stimulate contractions to help you deliver the placenta.  After the placenta is delivered, your uterus should tighten (contract) and become firm. This helps to stop bleeding in your uterus. To help your uterus contract and to control bleeding, your health care provider may:  Give you medicine by injection, through an IV tube, by mouth, or through your rectum (rectally).  Massage your abdomen or perform a vaginal exam to remove any blood clots that are left in your uterus.  Empty your bladder by placing a thin, flexible tube (catheter) into your bladder.  Encourage   you to breastfeed your baby. After labor is over, you and your baby will be monitored closely to ensure that you are both healthy until you are ready to go home. Your health care team will teach you how to care for yourself and your baby. This information is not intended to replace advice given to you by your health care provider. Make sure you discuss any questions you have with your health care provider. Document Released: 08/26/2008 Document Revised: 06/06/2016 Document Reviewed: 12/02/2015 Elsevier Interactive Patient Education  2017 [employer] Fetal Movement Counts Patient Name: ________________________________________________ Patient Due Date: ____________________ What is a fetal movement count? A fetal movement count is the number of times that you feel your baby move during a certain amount of time. This may also be called a fetal kick count. A fetal movement count is recommended for every pregnant woman. You may be asked to start counting fetal movements as early as week 28 of your pregnancy. Pay attention to when your baby is most active. You may notice your baby's sleep and wake cycles. You may also notice things that make your baby move more. You  should do a fetal movement count:  When your baby is normally most active.  At the same time each day. A good time to count movements is while you are resting, after having something to eat and drink. How do I count fetal movements? 1. Find a quiet, comfortable area. Sit, or lie down on your side. 2. Write down the date, the start time and stop time, and the number of movements that you felt between those two times. Take this information with you to your health care visits. 3. For 2 hours, count kicks, flutters, swishes, rolls, and jabs. You should feel at least 10 movements during 2 hours. 4. You may stop counting after you have felt 10 movements. 5. If you do not feel 10 movements in 2 hours, have something to eat and drink. Then, keep resting and counting for 1 hour. If you feel at least 4 movements during that hour, you may stop counting. Contact a health care provider if:  You feel fewer than 4 movements in 2 hours.  Your baby is not moving like he or she usually does. Date: ____________ Start time: ____________ Stop time: ____________ Movements: ____________ Date: ____________ Start time: ____________ Stop time: ____________ Movements: ____________ Date: ____________ Start time: ____________ Stop time: ____________ Movements: ____________ Date: ____________ Start time: ____________ Stop time: ____________ Movements: ____________ Date: ____________ Start time: ____________ Stop time: ____________ Movements: ____________ Date: ____________ Start time: ____________ Stop time: ____________ Movements: ____________ Date: ____________ Start time: ____________ Stop time: ____________ Movements: ____________ Date: ____________ Start time: ____________ Stop time: ____________ Movements: ____________ Date: ____________ Start time: ____________ Stop time: ____________ Movements: ____________ This information is not intended to replace advice given to you by your health care provider. Make sure  you discuss any questions you have with your health care provider. Document Released: 12/17/2006 Document Revised: 07/16/2016 Document Reviewed: 12/27/2015 Elsevier Interactive Patient Education  2017 [employer]

## 2017-04-17 NOTE — Progress Notes (Signed)
ROB w/NST-Pt doing well, no questions or concerns. NST performed today was reviewed and was found to be reactive.  Baseline 130 bpm with moderate variability, accelerations, and no decelerations present. Reviewed red flag symptoms and when to call. Discussed labor preparation techniques including perineal stretching. RTC x 1 week with NST and ROB.

## 2017-04-17 NOTE — Progress Notes (Signed)
Rob and nst- no complaints.  NONSTRESS TEST INTERPRETATION  INDICATIONS: Obestiy,   FHR baseline: 130s RESULTS:Reactive COMMENTS:    PLAN: 1. Continue fetal kick counts twice a day. 2. Continue antepartum testing as scheduled-weekly  Darol Destinerystal Kayton Dunaj, CMA

## 2017-04-24 ENCOUNTER — Ambulatory Visit (INDEPENDENT_AMBULATORY_CARE_PROVIDER_SITE_OTHER): Payer: Medicaid Other | Admitting: Obstetrics and Gynecology

## 2017-04-24 ENCOUNTER — Other Ambulatory Visit: Payer: Medicaid Other

## 2017-04-24 VITALS — BP 115/80 | HR 95

## 2017-04-24 DIAGNOSIS — Z3493 Encounter for supervision of normal pregnancy, unspecified, third trimester: Secondary | ICD-10-CM | POA: Diagnosis not present

## 2017-04-24 NOTE — Progress Notes (Signed)
ROB- NST performed today was reviewed and was found to be reactive. Baseline135 with Moderate variability; No decels noted.  Continue recommended antenatal testing and prenatal care.

## 2017-04-24 NOTE — Progress Notes (Signed)
ROB w/NST- pt is doing well, having some contractions

## 2017-04-30 ENCOUNTER — Ambulatory Visit (INDEPENDENT_AMBULATORY_CARE_PROVIDER_SITE_OTHER): Payer: Medicaid Other | Admitting: Certified Nurse Midwife

## 2017-04-30 VITALS — BP 123/84 | HR 80 | Wt 226.2 lb

## 2017-04-30 DIAGNOSIS — Z3483 Encounter for supervision of other normal pregnancy, third trimester: Secondary | ICD-10-CM | POA: Diagnosis not present

## 2017-04-30 LAB — POCT URINALYSIS DIPSTICK
Bilirubin, UA: NEGATIVE
Blood, UA: NEGATIVE
Glucose, UA: NEGATIVE
KETONES UA: NEGATIVE
LEUKOCYTES UA: NEGATIVE
Nitrite, UA: NEGATIVE
PH UA: 6.5 (ref 5.0–8.0)
PROTEIN UA: NEGATIVE
Spec Grav, UA: 1.01 (ref 1.010–1.025)
UROBILINOGEN UA: 0.2 U/dL

## 2017-04-30 NOTE — Patient Instructions (Signed)
Augmentation of Labor Augmentation of labor is when steps are taken to stimulate and strengthen uterine contractions during labor. This may be done when the contractions have slowed down or stopped, delaying progress of labor and delivery of the baby. Before beginning augmentation of labor, the health care provider will evaluate the condition of the mother and baby, the size and position of the baby, and the size of the birth canal. What are the reasons for labor augmentation? Reasons for augmentation of labor include:  Slow labor (prolonged first and second stage of labor) that has been associated with increased maternal risks, such as chorioamnionitis, postpartum hemorrhage, operative vaginal delivery, or third-degree or fourth-degree perineal lacerations.  Decreased average length of labor.  What methods are used for labor augmentation? Various methods may be used for augmentation of labor, including:  Oxytocin medicine. This medicine stimulates contractions. It is given through an IV access tube inserted into a vein.  Breaking the fluid-filled sac that surrounds the fetus (amniotic sac).  Stripping the membranes. The health care provider separates amniotic sac tissue from the cervix, causing the release of a hormone called progesterone that can stimulate uterine contractions.  Nipple stimulation.  Stimulation of certain pressure points on the ankles.  Manual or mechanical dilation of the cervix.  What are the risks associated with labor augmentation?  Overstimulation of the uterine contractions (continuous, prolonged, very strong contractions), causing fetal distress.  Increased chance of infection for the mother and baby.  Uterine tearing (rupture).  Breaking off (abruption) of the placenta.  Increased chance of cesarean, forceps, or vacuum delivery. What are some reasons for not doing labor augmentation? Augmentation of labor should not be done if:  The baby is too big for  the birth canal. This can be confirmed by ultrasonography.  The umbilical cord drops in front of the baby's head or breech part (prolapsed cord).  The mother had a previous cesarean delivery with a vertical incision in the uterus (or the kind of incision used is not known). High dose oxytocin should not be used if the mother had a previous cesarean delivery of any kind.  The mother had previous surgery on or into the uterus.  The mother has herpes.  The mother has cervical cancer.  The baby is lying sideways.  The mother's pelvis is deformed.  The mother is pregnant with more than two babies.  This information is not intended to replace advice given to you by your health care provider. Make sure you discuss any questions you have with your health care provider. Document Released: 05/12/2007 Document Revised: 04/30/2016 Document Reviewed: 06/16/2013 Elsevier Interactive Patient Education  2017 [employer] Inc.  

## 2017-04-30 NOTE — Progress Notes (Signed)
ROB-NST performed today was reviewed and found to be reactive. Baseline 120 bpm, moderate variability, accelerations present and no decelerations. Reviewed red flag symptoms and when to call. Pt scheduled for IOL 05/01/17 at 0800 for postdates pregnancy and grade 3 placenta. RTC x 6 weeks for PPV.

## 2017-05-01 ENCOUNTER — Inpatient Hospital Stay
Admission: RE | Admit: 2017-05-01 | Discharge: 2017-05-04 | DRG: 775 | Disposition: A | Discharge status: Home / Self Care | Payer: Medicaid Other | Source: Ambulatory Visit | Attending: Certified Nurse Midwife | Admitting: Certified Nurse Midwife

## 2017-05-01 ENCOUNTER — Observation Stay
Admission: EM | Admit: 2017-05-01 | Discharge: 2017-05-01 | Disposition: A | Discharge status: Home / Self Care | Payer: Medicaid Other | Source: Home / Self Care | Admitting: Obstetrics and Gynecology

## 2017-05-01 DIAGNOSIS — O26893 Other specified pregnancy related conditions, third trimester: Secondary | ICD-10-CM | POA: Diagnosis present

## 2017-05-01 DIAGNOSIS — O288 Other abnormal findings on antenatal screening of mother: Secondary | ICD-10-CM

## 2017-05-01 DIAGNOSIS — O480 Post-term pregnancy: Secondary | ICD-10-CM | POA: Insufficient documentation

## 2017-05-01 DIAGNOSIS — N801 Endometriosis of ovary: Secondary | ICD-10-CM

## 2017-05-01 DIAGNOSIS — Z3A40 40 weeks gestation of pregnancy: Secondary | ICD-10-CM

## 2017-05-01 DIAGNOSIS — O4103X0 Oligohydramnios, third trimester, not applicable or unspecified: Secondary | ICD-10-CM | POA: Diagnosis present

## 2017-05-01 DIAGNOSIS — Z3490 Encounter for supervision of normal pregnancy, unspecified, unspecified trimester: Secondary | ICD-10-CM | POA: Diagnosis present

## 2017-05-01 DIAGNOSIS — O99824 Streptococcus B carrier state complicating childbirth: Secondary | ICD-10-CM | POA: Diagnosis present

## 2017-05-01 DIAGNOSIS — Z87891 Personal history of nicotine dependence: Secondary | ICD-10-CM

## 2017-05-01 DIAGNOSIS — Z6791 Unspecified blood type, Rh negative: Secondary | ICD-10-CM

## 2017-05-01 NOTE — OB Triage Note (Signed)
Ms. Cletus GashWitherspoon here for NST, history of grade 3 placenta

## 2017-05-01 NOTE — OB Triage Note (Signed)
Patient arrived in triage with complaints of decreased fetal movement.  M. Lawhorn CNM on unit at time of patient's arrival, discussed complaints and assessment findings. States fetal movement noted at present, occasional contractions, denies leaking of fluid or vaginal bleeding.  Abdomen palpates soft.  EFM applied. Discussed plan of care. Patient and family verbalized understanding.

## 2017-05-01 NOTE — H&P (Signed)
Obstetric History and Physical  [name] is a 26 y.o. G1P0 with IUP at [redacted]w[redacted]d presenting for induction of labor due to postdates, oligohydramnios, and grade 3 placenta.   Patient states she has been having irregular contractions, none vaginal bleeding, intact membranes, with decreased  fetal movement.    Prenatal Course  Source of Care: [facility], initial visit: 14 weeks, 11 total visit  Pregnancy complications or risks: oligohydramnios, grade 3 placenta oa 37 week ultrasound, postdates pregnancy  Prenatal labs and studies:  ABO, Rh: --/--/A NEG (06/02 1610)  Antibody: POS (06/02 0208)  Rubella: 3.48 (12/05 1346)   Varicella: 818 (12/05 1346)  RPR: Non Reactive (12/05 1346)   HBsAg: Negative (12/05 1346)   HIV: Non Reactive (12/05 1346)   RUE:[id number] (05/04 1028)  1 hr Glucola  114  Genetic screening normal  Anatomy US normal  History reviewed. No pertinent past medical history.  Past Surgical History:  Procedure Laterality Date  . WISDOM TOOTH EXTRACTION      OB History  Gravida Para Term Preterm AB Living  1            SAB TAB Ectopic Multiple Live Births               # Outcome Date GA Lbr Len/2nd Weight Sex Delivery Anes PTL Lv  1 Current               Social History   Social History  . Marital status: Married    Spouse name: N/A  . Number of children: N/A  . Years of education: N/A   Social History Main Topics  . Smoking status: Former Smoker    Quit date: 11/24/2014  . Smokeless tobacco: Never Used  . Alcohol use No  . Drug use: No  . Sexual activity: Yes    Partners: Male   Other Topics Concern  . None   Social History Narrative  . None    Family History  Problem Relation Age of Onset  . Hypertension Mother   . Cancer Mother        SKIN CANCER  . Hypertension Father   . Cancer Father        PROSTATE CANCER  . Seizures Brother   . Cancer Maternal Grandmother        BRAIN TUMOR    Prescriptions Prior to Admission   Medication Sig Dispense Refill Last Dose  . prenatal vitamin w/FE, FA (PRENATAL 1 + 1) 27-1 MG TABS tablet Take 1 tablet by mouth daily at 12 noon.   Past Week at Unknown time  . ranitidine (ZANTAC) 150 MG tablet Take 150 mg by mouth 2 (two) times daily.   05/01/2017 at 1700    No Known Allergies  Review of Systems: Negative except for what is mentioned in HPI.  Physical Exam:  Temp:  [97.8 F (36.6 C)-98.3 F (36.8 C)] 98.2 F (36.8 C) (06/02 0408) Pulse Rate:  [64-88] 64 (06/02 0613) Resp:  [16-18] 16 (06/02 0613) BP: (81-149)/(42-93) 121/78 (06/02 0613) SpO2:  [99 %] 99 % (06/02 0300) Weight:  [226 lb (102.5 kg)] 226 lb (102.5 kg) (06/01 2115)  GENERAL: Well-developed, well-nourished female in no acute distress.   LUNGS: Clear to auscultation bilaterally.   HEART: Regular rate and rhythm.  ABDOMEN: Soft, nontender, nondistended, gravid.  EXTREMITIES: Nontender, no edema, 2+ distal pulses.  Cervical Exam: Deferred  FHT:  Baseline rate 140 bpm   Variability moderate  Accelerations  present   Decelerations none  Contractions: None  Bedside ultrasound completed by Dr. Valentino Saxonherry and AFI < 5%.   Pertinent Labs/Studies:   Results for orders placed or performed during the hospital encounter of 05/01/17 (from the past 24 hour(s))  CBC     Status: None   Collection Time: 05/02/17  2:08 AM  Result Value Ref Range   WBC 10.0 3.6 - 11.0 K/uL   RBC 4.38 3.80 - 5.20 MIL/uL   Hemoglobin 13.0 12.0 - 16.0 g/dL   HCT 16.137.8 09.635.0 - 04.547.0 %   MCV 86.5 80.0 - 100.0 fL   MCH 29.6 26.0 - 34.0 pg   MCHC 34.3 32.0 - 36.0 g/dL   RDW 40.914.0 81.111.5 - 91.414.5 %   Platelets 243 150 - 440 K/uL  Type and screen [facility]     Status: None (Preliminary result)   Collection Time: 05/02/17  2:08 AM  Result Value Ref Range   ABO/RH(D) A NEG    Antibody Screen POS    Sample Expiration 05/05/2017    Antibody Identification PASSIVELY ACQUIRED ANTI-D    Unit Number [id number]     Blood Component Type RBC LR PHER2    Unit division 00    Status of Unit ALLOCATED    Transfusion Status OK TO TRANSFUSE    Crossmatch Result COMPATIBLE    Unit Number [id number]    Blood Component Type RED CELLS,LR    Unit division 00    Status of Unit ALLOCATED    Transfusion Status OK TO TRANSFUSE    Crossmatch Result COMPATIBLE   ABO/Rh     Status: None   Collection Time: 05/02/17  6:33 AM  Result Value Ref Range   ABO/RH(D) A NEG     Assessment :  [name] is a 26 y.o. G1P0 at [id number] being admitted for induction of labor due to postdates pregnancy, oligohydramnios, and grade 3 placenta on 37 weeks US; Rh negative, Group beta step positive, desires epidural, plans breastfeeding and mini pill as contraception  FHR Category I  Plan:  Admit to birthing suites for induction of labor, see orders.  Start GBS prophylaxis in active labor.  Continue orders as written.  Reassess as needed.   Gunnar BullaJenkins Michelle Shelvy Heckert, CNM [facility]  Dr Valentino Saxonherry aware of admission and Plan of care.

## 2017-05-01 NOTE — Discharge Instructions (Signed)
Hypertension During Pregnancy Hypertension is also called high blood pressure. High blood pressure means that the force of your blood moving in your body is too strong. When you are pregnant, this condition should be watched carefully. It can cause problems for you and your baby. Follow these instructions at home: Eating and drinking  Drink enough fluid to keep your pee (urine) clear or pale yellow.  Eat healthy foods that are low in salt (sodium). ? Do not add salt to your food. ? Check labels on foods and drinks to see how much salt is in them. Look on the label where you see "Sodium." Lifestyle  Do not use any products that contain nicotine or tobacco, such as cigarettes and e-cigarettes. If you need help quitting, ask your doctor.  Do not use alcohol.  Avoid caffeine.  Avoid stress. Rest and get plenty of sleep. General instructions  Take over-the-counter and prescription medicines only as told by your doctor.  While lying down, lie on your left side. This keeps pressure off your baby.  While sitting or lying down, raise (elevate) your feet. Try putting some pillows under your lower legs.  Exercise regularly. Ask your doctor what kinds of exercise are best for you.  Keep all prenatal and follow-up visits as told by your doctor. This is important. Contact a doctor if:  You have symptoms that your doctor told you to watch for, such as: ? Fever. ? Throwing up (vomiting). ? Headache. Get help right away if:  You have very bad pain in your belly (abdomen).  You are throwing up, and this does not get better with treatment.  You suddenly get swelling in your hands, ankles, or face.  You gain 4 lb (1.8 kg) or more in 1 week.  You get bleeding from your vagina.  You have blood in your pee.  You do not feel your baby moving as much as normal.  You have a change in vision.  You have muscle twitching or sudden tightening (spasms).  You have trouble breathing.  Your  lips or fingernails turn blue. This information is not intended to replace advice given to you by your health care provider. Make sure you discuss any questions you have with your health care provider. Document Released: 12/20/2010 Document Revised: 07/29/2016 Document Reviewed: 07/29/2016 Elsevier Interactive Patient Education  2017 [employer]

## 2017-05-01 NOTE — Discharge Summary (Signed)
Obstetric Discharge Summary  Patient ID: [name] MRN: [id number] DOB/AGE: 27/07/1991 27 y.o.   Date of Admission: 05/01/2017 Serafina RoyalsMichelle Lanesha Azzaro, CNM [facility](A. Cherry, MD)  Date of Discharge: 05/01/2017 Serafina RoyalsMichelle Adriel Kessen, CNM [facility](A. Cherry, MD)  Admitting Diagnosis: Induction of labor at 6959w1d  Secondary Diagnosis: postdates and grade 3 placenta  Discharge Diagnosis: Same as above.   Brief Hospital Course   L&D OB Triage Note  [name] is a 26 y.o. G1P0 female at 3959w1d, EDD Estimated Date of Delivery: 04/30/17 who presented to labor and delivery for induction of labor.  Due to inadequate staffing and limited space, pt was unable to be admitted to birthing suites. An NST was performed prior to patient discharge and she is to be contacted for induction of labor as soon as a bed is available on the unit. Vital signs stable. An NST was performed and has been reviewed by CNM.  NST INTERPRETATION: Indications: post-dates pregnancy and grade 3 placenta  Mode: External Baseline Rate (A): 130 bpm Variability: Moderate Accelerations: 15 x 15 Decelerations: None   Overall Impression: Reassuring for gestational age Contraction Frequency (min): occasional  Impression: reactive  Plan: NST performed was reviewed and was found to be reactive. She was discharged home with bleeding/labor precautions. Birthing suites will contact patient for induction of labor when bed and nurse available on unit.   Discharge Instructions: Per After Visit Summary.  Activity: Advance as tolerated. Also refer to After Visit Summary  Diet: Regular  Medications: Allergies as of 05/01/2017   No Known Allergies     Medication List    ASK your doctor about these medications   prenatal vitamin w/FE, FA 27-1 MG Tabs tablet Take 1 tablet by mouth daily at 12 noon.   ranitidine 150 MG tablet Commonly known as:  ZANTAC Take 150 mg by mouth 2 (two) times daily.      Outpatient follow up: Call  office to schedule six (6) week postpartum visit.   Discharged Condition: stable  Discharged to: home    Gunnar BullaJenkins Michelle Jamiee Milholland, [place]

## 2017-05-02 ENCOUNTER — Inpatient Hospital Stay: Payer: Medicaid Other | Admitting: Anesthesiology

## 2017-05-02 DIAGNOSIS — Z87891 Personal history of nicotine dependence: Secondary | ICD-10-CM | POA: Diagnosis not present

## 2017-05-02 DIAGNOSIS — O480 Post-term pregnancy: Secondary | ICD-10-CM | POA: Diagnosis present

## 2017-05-02 DIAGNOSIS — Z3490 Encounter for supervision of normal pregnancy, unspecified, unspecified trimester: Secondary | ICD-10-CM | POA: Diagnosis present

## 2017-05-02 DIAGNOSIS — O26893 Other specified pregnancy related conditions, third trimester: Secondary | ICD-10-CM | POA: Diagnosis present

## 2017-05-02 DIAGNOSIS — Z6791 Unspecified blood type, Rh negative: Secondary | ICD-10-CM | POA: Diagnosis not present

## 2017-05-02 DIAGNOSIS — Z3A40 40 weeks gestation of pregnancy: Secondary | ICD-10-CM | POA: Diagnosis not present

## 2017-05-02 DIAGNOSIS — O4103X0 Oligohydramnios, third trimester, not applicable or unspecified: Secondary | ICD-10-CM | POA: Diagnosis present

## 2017-05-02 DIAGNOSIS — O99824 Streptococcus B carrier state complicating childbirth: Secondary | ICD-10-CM | POA: Diagnosis present

## 2017-05-02 DIAGNOSIS — O288 Other abnormal findings on antenatal screening of mother: Secondary | ICD-10-CM

## 2017-05-02 LAB — CBC
HEMATOCRIT: 37.8 % (ref 35.0–47.0)
Hemoglobin: 13 g/dL (ref 12.0–16.0)
MCH: 29.6 pg (ref 26.0–34.0)
MCHC: 34.3 g/dL (ref 32.0–36.0)
MCV: 86.5 fL (ref 80.0–100.0)
Platelets: 243 10*3/uL (ref 150–440)
RBC: 4.38 MIL/uL (ref 3.80–5.20)
RDW: 14 % (ref 11.5–14.5)
WBC: 10 10*3/uL (ref 3.6–11.0)

## 2017-05-02 LAB — ABO/RH: ABO/RH(D): A NEG

## 2017-05-02 MED ORDER — MISOPROSTOL 25 MCG QUARTER TABLET
25.0000 ug | ORAL_TABLET | ORAL | Status: DC | PRN
  Administered 2017-05-02 (×2): 25 ug via VAGINAL
  Filled 2017-05-02 (×2): qty 1

## 2017-05-02 MED ORDER — SOD CITRATE-CITRIC ACID 500-334 MG/5ML PO SOLN: 30.0000 mL | ORAL | Status: DC | PRN

## 2017-05-02 MED ORDER — OXYCODONE-ACETAMINOPHEN 5-325 MG PO TABS: 2.0000 | ORAL_TABLET | ORAL | Status: DC | PRN

## 2017-05-02 MED ORDER — ACETAMINOPHEN 325 MG PO TABS
650.0000 mg | ORAL_TABLET | ORAL | Status: DC | PRN
  Administered 2017-05-02 – 2017-05-03 (×3): 650 mg via ORAL
  Filled 2017-05-02 (×3): qty 2

## 2017-05-02 MED ORDER — LIDOCAINE HCL (PF) 1 % IJ SOLN
30.0000 mL | INTRAMUSCULAR | Status: DC | PRN
  Administered 2017-05-03: 30 mL via SUBCUTANEOUS

## 2017-05-02 MED ORDER — FAMOTIDINE 20 MG PO TABS
20.0000 mg | ORAL_TABLET | Freq: Two times a day (BID) | ORAL | Status: DC | PRN
  Administered 2017-05-02: 20 mg via ORAL

## 2017-05-02 MED ORDER — DIPHENHYDRAMINE HCL 50 MG/ML IJ SOLN: 12.5000 mg | INTRAMUSCULAR | Status: DC | PRN

## 2017-05-02 MED ORDER — OXYCODONE-ACETAMINOPHEN 5-325 MG PO TABS: 1.0000 | ORAL_TABLET | ORAL | Status: DC | PRN

## 2017-05-02 MED ORDER — ONDANSETRON HCL 4 MG/2ML IJ SOLN
4.0000 mg | Freq: Four times a day (QID) | INTRAMUSCULAR | Status: DC | PRN
  Administered 2017-05-02 – 2017-05-03 (×2): 4 mg via INTRAVENOUS
  Filled 2017-05-02 (×2): qty 2

## 2017-05-02 MED ORDER — FAMOTIDINE 20 MG PO TABS
ORAL_TABLET | ORAL | Status: AC
  Filled 2017-05-02: qty 1

## 2017-05-02 MED ORDER — EPHEDRINE 5 MG/ML INJ: 10.0000 mg | INTRAVENOUS | Status: DC | PRN

## 2017-05-02 MED ORDER — PENICILLIN G POTASSIUM 5000000 UNITS IJ SOLR
5.0000 10*6.[IU] | Freq: Once | INTRAVENOUS | Status: AC
  Administered 2017-05-02: 5 10*6.[IU] via INTRAVENOUS
  Filled 2017-05-02: qty 5

## 2017-05-02 MED ORDER — FENTANYL 2.5 MCG/ML W/ROPIVACAINE 0.2% IN NS 100 ML EPIDURAL INFUSION (ARMC-ANES)
EPIDURAL | Status: AC
  Filled 2017-05-02: qty 100

## 2017-05-02 MED ORDER — TERBUTALINE SULFATE 1 MG/ML IJ SOLN: 0.2500 mg | Freq: Once | INTRAMUSCULAR | Status: DC | PRN

## 2017-05-02 MED ORDER — SODIUM CHLORIDE 0.9 % IJ SOLN
INTRAMUSCULAR | Status: AC
  Filled 2017-05-02: qty 50

## 2017-05-02 MED ORDER — LACTATED RINGERS IV SOLN
INTRAVENOUS | Status: DC
  Administered 2017-05-02 – 2017-05-03 (×4): via INTRAVENOUS

## 2017-05-02 MED ORDER — ZOLPIDEM TARTRATE 5 MG PO TABS: 5.0000 mg | ORAL_TABLET | Freq: Every evening | ORAL | Status: DC | PRN

## 2017-05-02 MED ORDER — FENTANYL 2.5 MCG/ML W/ROPIVACAINE 0.2% IN NS 100 ML EPIDURAL INFUSION (ARMC-ANES)
10.0000 mL/h | EPIDURAL | Status: DC
  Administered 2017-05-03: 10 mL/h via EPIDURAL

## 2017-05-02 MED ORDER — PHENYLEPHRINE 40 MCG/ML (10ML) SYRINGE FOR IV PUSH (FOR BLOOD PRESSURE SUPPORT): 80.0000 ug | PREFILLED_SYRINGE | INTRAVENOUS | Status: DC | PRN

## 2017-05-02 MED ORDER — BUTORPHANOL TARTRATE 2 MG/ML IJ SOLN: 1.0000 mg | INTRAMUSCULAR | Status: DC | PRN

## 2017-05-02 MED ORDER — BUPIVACAINE HCL (PF) 0.25 % IJ SOLN
INTRAMUSCULAR | Status: DC | PRN
  Administered 2017-05-02 – 2017-05-03 (×4): 5 mL via EPIDURAL

## 2017-05-02 MED ORDER — LACTATED RINGERS IV SOLN
500.0000 mL | INTRAVENOUS | Status: DC | PRN
  Administered 2017-05-02: 500 mL via INTRAVENOUS

## 2017-05-02 MED ORDER — LACTATED RINGERS IV SOLN: 500.0000 mL | Freq: Once | INTRAVENOUS | Status: DC

## 2017-05-02 MED ORDER — OXYTOCIN 40 UNITS IN LACTATED RINGERS INFUSION - SIMPLE MED
1.0000 m[IU]/min | INTRAVENOUS | Status: DC
  Administered 2017-05-02: 2 m[IU]/min via INTRAVENOUS
  Administered 2017-05-03: 1 m[IU]/min via INTRAVENOUS
  Filled 2017-05-02: qty 1000

## 2017-05-02 MED ORDER — FLEET ENEMA 7-19 GM/118ML RE ENEM: 1.0000 | ENEMA | Freq: Every day | RECTAL | Status: DC | PRN

## 2017-05-02 MED ORDER — LIDOCAINE-EPINEPHRINE (PF) 1.5 %-1:200000 IJ SOLN
INTRAMUSCULAR | Status: DC | PRN
  Administered 2017-05-02: 3 mL

## 2017-05-02 MED ORDER — OXYTOCIN 40 UNITS IN LACTATED RINGERS INFUSION - SIMPLE MED: 2.5000 [IU]/h | INTRAVENOUS | Status: DC

## 2017-05-02 MED ORDER — PENICILLIN G POT IN DEXTROSE 60000 UNIT/ML IV SOLN
3.0000 10*6.[IU] | INTRAVENOUS | Status: DC
  Administered 2017-05-02 – 2017-05-03 (×3): 3 10*6.[IU] via INTRAVENOUS
  Filled 2017-05-02 (×11): qty 50

## 2017-05-02 MED ORDER — FENTANYL 2.5 MCG/ML W/ROPIVACAINE 0.2% IN NS 100 ML EPIDURAL INFUSION (ARMC-ANES)
EPIDURAL | Status: DC | PRN
  Administered 2017-05-02: 10 mL/h via EPIDURAL

## 2017-05-02 MED ORDER — OXYTOCIN BOLUS FROM INFUSION
500.0000 mL | Freq: Once | INTRAVENOUS | Status: DC
  Administered 2017-05-03: 500 mL via INTRAVENOUS

## 2017-05-02 MED ORDER — SODIUM CHLORIDE FLUSH 0.9 % IV SOLN
INTRAVENOUS | Status: AC
  Filled 2017-05-02: qty 10

## 2017-05-02 NOTE — Anesthesia Preprocedure Evaluation (Addendum)
Anesthesia Evaluation  Patient identified by MRN, date of birth, ID band Patient awake    Reviewed: Allergy & Precautions, NPO status , Patient's Chart, lab work & pertinent test results  History of Anesthesia Complications Negative for: history of anesthetic complications  Airway Mallampati: II  TM Distance: >3 FB Neck ROM: Full    Dental no notable dental hx.    Pulmonary neg sleep apnea, neg COPD, former smoker,    breath sounds clear to auscultation- rhonchi (-) wheezing      Cardiovascular Exercise Tolerance: Good (-) hypertension(-) CAD and (-) Past MI  Rhythm:Regular Rate:Normal - Systolic murmurs and - Diastolic murmurs    Neuro/Psych  Headaches, negative psych ROS   GI/Hepatic negative GI ROS, Neg liver ROS,   Endo/Other  negative endocrine ROSneg diabetes  Renal/GU negative Renal ROS     Musculoskeletal negative musculoskeletal ROS (+)   Abdominal (+) + obese, Gravid abdomen  Peds  Hematology negative hematology ROS (+)   Anesthesia Other Findings   Reproductive/Obstetrics                             Anesthesia Physical Anesthesia Plan  ASA: II  Anesthesia Plan: Epidural   Post-op Pain Management:    Induction:   Airway Management Planned:   Additional Equipment:   Intra-op Plan:   Post-operative Plan:   Informed Consent: I have reviewed the patients History and Physical, chart, labs and discussed the procedure including the risks, benefits and alternatives for the proposed anesthesia with the patient or authorized representative who has indicated his/her understanding and acceptance.     Plan Discussed with: Anesthesiologist  Anesthesia Plan Comments: (Plan for epidural for labor, discussed epidural vs spinal vs GA if need for csection)        Lab Results  Component Value Date   WBC 10.0 05/02/2017   HGB 13.0 05/02/2017   HCT 37.8 05/02/2017   MCV  86.5 05/02/2017   PLT 243 05/02/2017    Anesthesia Quick Evaluation

## 2017-05-02 NOTE — Progress Notes (Signed)
Carlis AbbottRebecca Furney is a 26 y.o. G1P0 at 3320w2d by LMP admitted for induction of labor due to Post dates. Due date 04/30/2017, Low amniotic fluid. and grade 3 placenta.   Subjective:  Per Amil AmenJulia, RN-Pt on birthing ball reporting increased frequency and intensity of contractions. Foley bulb still in place.   Endorses good fetal movement. Denies vaginal bleeding or leakage of fluid.   Objective:  Temp:  [96.5 F (35.8 C)-98.3 F (36.8 C)] 97.9 F (36.6 C) (06/02 1032) Pulse Rate:  [50-84] 50 (06/02 1032) Resp:  [16-18] 16 (06/02 0718) BP: (81-133)/(42-93) 133/78 (06/02 1032) SpO2:  [99 %] 99 % (06/02 0300) Weight:  [226 lb (102.5 kg)] 226 lb (102.5 kg) (06/01 2115)  FHT:  FHR: 145 bpm, variability: moderate,  accelerations:  Present,  decelerations:  Absent   UC:   regular, every two (2) to four (4) minutes  SVE:   Not indicated at this time  Labs:  Lab Results  Component Value Date   WBC 10.0 05/02/2017   HGB 13.0 05/02/2017   HCT 37.8 05/02/2017   MCV 86.5 05/02/2017   PLT 243 05/02/2017    Assessment:  Idelle JoRebecca Withersppon is a 26 y.o. G1P0 at 40+2 weeks her for induction of labor due to postdates, oligohydramnios, and grade 3 placenta; Rh negative, GBS positive  FHR Category I  Plan:  Continue orders as written.   Next dose of Cytotec may be give orally since pt using birthing ball and position changes as non-pharmacologic pain management techniques  Reassess as needed.    Serafina RoyalsMichelle Alok Minshall 05/02/2017, 12:36 PM

## 2017-05-02 NOTE — Progress Notes (Signed)
Cherylyn Archuleta is a 26 y.o.Carlis Abbott G1P0 at 7879w2d by LMP admitted for induction of labor due to Post dates. Due date 04/30/2017, Low amniotic fluid. and grade 3 placenta.  Subjective:  Pt resting quietly in bed, reports pain relief since epidural placement.   Endorses good fetal movement. Denies vaginal bleeding or leakage of fluid from the vagina.   Family members at bedside.   Objective:  Temp:  [96.5 F (35.8 C)-98.3 F (36.8 C)] 97.8 F (36.6 C) (06/02 1907) Pulse Rate:  [45-84] 46 (06/02 1923) Resp:  [14-18] 16 (06/02 1907) BP: (81-140)/(42-93) 135/64 (06/02 1923) SpO2:  [97 %-99 %] 97 % (06/02 1935) Weight:  [226 lb (102.5 kg)] 226 lb (102.5 kg) (06/01 2115)   I/O last 3 completed shifts: In: 488.4 [I.V.:488.4] Out: -  No intake/output data recorded.  Fetal Wellbeing:  Category I   UC:   regular, every two (2) to three (3) minutes  SVE: 4/80/-3, vertex by ER RN   Labs:  Lab Results  Component Value Date   WBC 10.0 05/02/2017   HGB 13.0 05/02/2017   HCT 37.8 05/02/2017   MCV 86.5 05/02/2017   PLT 243 05/02/2017    Assessment:   Carlis AbbottRebecca Hullum is a 26 y.o. G1P0 at 40+2 weeks here for induction of labor due to postdates, oligohydramnios, and grade 3 placenta; Rh negative, GBS positive, pitocin, epidural  FHR Category I  Plan:  Continue orders as written.   Reassess as needed.    Serafina RoyalsMichelle Ellar Hakala 05/02/2017, 8:06 PM

## 2017-05-02 NOTE — Anesthesia Procedure Notes (Signed)
Epidural Patient location during procedure: OB Start time: 05/02/2017 6:17 PM End time: 05/02/2017 6:40 PM  Staffing Anesthesiologist: Alver FisherPENWARDEN, [name]: anesthesiologist   Preanesthetic Checklist Completed: patient identified, site marked, surgical consent, pre-op evaluation, timeout Morris, IV checked, risks and benefits discussed and monitors and equipment checked  Epidural Patient position: sitting Prep: ChloraPrep Patient monitoring: heart rate, continuous pulse ox and blood pressure Approach: midline Location: L3-L4 Injection technique: LOR saline  Needle:  Needle type: Tuohy  Needle gauge: 18 G Needle length: 9 cm and 9 Needle insertion depth: 7 cm Catheter type: closed end flexible Catheter size: 20 Guage Catheter at skin depth: 11 cm Test dose: negative (0.125% bupivacaine)  Assessment Events: blood not aspirated, injection not painful, no injection resistance, negative IV test and no paresthesia  Additional Notes   Patient tolerated the insertion well without complications.Reason for block:procedure for pain

## 2017-05-02 NOTE — Progress Notes (Addendum)
Marcia AbbottRebecca Morris is a 26 y.o. G1P0 at 379w2d by LMP admitted for induction of labor due to Post dates. Due date 04/30/2017, Low amniotic fluid. and grade 3 placenta.  Subjective:  Pt sitting in bed, breathing through contractions with the use of nitrous oxide. Reports pain: 8/10 scale plus pelvic pressure during contractions.   Endorses good fetal movement. Denies vaginal bleeding or leakage of fluid from the vagina.   Family members at bedside.   Objective:  Temp:  [96.5 F (35.8 C)-98.3 F (36.8 C)] 97.8 F (36.6 C) (06/02 1429) Pulse Rate:  [45-84] 45 (06/02 1547) Resp:  [16-18] 16 (06/02 1547) BP: (81-140)/(42-93) 129/69 (06/02 1547) SpO2:  [98 %-99 %] 98 % (06/02 1635) Weight:  [226 lb (102.5 kg)] 226 lb (102.5 kg) (06/01 2115)   I/O last 3 completed shifts: In: 485.4 [I.V.:485.4] Out: -  Total I/O In: 3 [I.V.:3] Out: -   Fetal Wellbeing:  Category I   UC:   regular, every two (2) to three (3) minutes  SVE:   Deferred, foley bulb removed with gentle downward traction  Labs:  Lab Results  Component Value Date   WBC 10.0 05/02/2017   HGB 13.0 05/02/2017   HCT 37.8 05/02/2017   MCV 86.5 05/02/2017   PLT 243 05/02/2017    Assessment:   Marcia AbbottRebecca Morris is a 26 y.o. G1P0 at 40+2 weeks here for induction of labor due to postdates, oligohydramnios, and grade 3 placenta; Rh negative, GBS positive, pitocin, nitrous oxide  FHR Category I  Plan:  Continue orders as written.   Reassess as needed.    Serafina RoyalsMichelle Airon Sahni 05/02/2017, 4:48 PM

## 2017-05-02 NOTE — Progress Notes (Signed)
Marcia AbbottRebecca Morris is a 26 y.o. G1P0 at 7128w2d by LMP admitted for induction of labor due to Post dates. Due date 04/30/2017, Low amniotic fluid. and grade 3 placenta.  Subjective:  Pt resting quietly in bed with spouse and mother at bedside. Questions regarding next steps during induction of labor.   Reports regular "period like" cramps. Pain: 2-3/10 scale.   She endorses good fetal movement. Denies vaginal bleeding or leakage of fluid from her vagina.   Denies difficulty breathing or respiratory distress, chest pain, abdominal pain, and leg pain or swelling.   Objective:  Temp:  [96.5 F (35.8 C)-98.3 F (36.8 C)] 96.5 F (35.8 C) (06/02 0718) Pulse Rate:  [59-88] 59 (06/02 0718) Resp:  [16-18] 16 (06/02 0718) BP: (81-149)/(42-93) 132/78 (06/02 0718) SpO2:  [99 %] 99 % (06/02 0300) Weight:  [226 lb (102.5 kg)] 226 lb (102.5 kg) (06/01 2115)  FHT:  FHR: 125 bpm, variability: moderate,  accelerations:  Present,  decelerations:  Absent  UC:   regular, every two (2) to four (4) minutes, palpate mild with soft resting tone  SVE:   Dilation: 2.5 Effacement (%): 50 Station: -3 Exam by:: M.Kholton Coate CNM   Foley bulb placed without difficulty and inflated with 30 ml of sterile saline. Cord clamped placed to end of foley bulb with evacuation port still open. Secured to leg with tape. Cytotec 25 mcg placed after procedure.  Labs:  Lab Results  Component Value Date   WBC 10.0 05/02/2017   HGB 13.0 05/02/2017   HCT 37.8 05/02/2017   MCV 86.5 05/02/2017   PLT 243 05/02/2017   Assessment:  Marcia AbbottRebecca Morris is a 26 y.o. G1P0 at 40+2 weeks here for induction of labor, oligohydramnios, and grade 3 placenta; Rh negative, GBS positive  FHR Category I  Plan:  Bedrest for one hour after Cytotec and foley bulb placement.  Start GBS prophylaxis in active labor or with pitocin.  Epidural when desired.  Continue orders as written.  Reassess as needed.   Gunnar BullaJenkins Michelle Weldon Nouri,  CNM 05/02/2017, 8:41 AM

## 2017-05-03 DIAGNOSIS — Z3A40 40 weeks gestation of pregnancy: Secondary | ICD-10-CM

## 2017-05-03 DIAGNOSIS — O480 Post-term pregnancy: Principal | ICD-10-CM

## 2017-05-03 LAB — RPR: RPR Ser Ql: NONREACTIVE

## 2017-05-03 MED ORDER — ZOLPIDEM TARTRATE 5 MG PO TABS
5.0000 mg | ORAL_TABLET | Freq: Every evening | ORAL | Status: DC | PRN
Start: 2017-05-03 — End: 2017-05-04

## 2017-05-03 MED ORDER — DOCUSATE SODIUM 100 MG PO CAPS
100.0000 mg | ORAL_CAPSULE | Freq: Two times a day (BID) | ORAL | Status: DC
  Administered 2017-05-03 – 2017-05-04 (×2): 100 mg via ORAL
  Filled 2017-05-03 (×2): qty 1

## 2017-05-03 MED ORDER — FAMOTIDINE 20 MG PO TABS
20.0000 mg | ORAL_TABLET | Freq: Two times a day (BID) | ORAL | Status: DC
  Administered 2017-05-03 – 2017-05-04 (×3): 20 mg via ORAL
  Filled 2017-05-03 (×3): qty 1

## 2017-05-03 MED ORDER — FENTANYL 2.5 MCG/ML W/ROPIVACAINE 0.2% IN NS 100 ML EPIDURAL INFUSION (ARMC-ANES)
EPIDURAL | Status: AC
  Filled 2017-05-03: qty 100

## 2017-05-03 MED ORDER — IBUPROFEN 600 MG PO TABS
600.0000 mg | ORAL_TABLET | Freq: Four times a day (QID) | ORAL | Status: DC
  Administered 2017-05-03 – 2017-05-04 (×4): 600 mg via ORAL
  Filled 2017-05-03 (×4): qty 1

## 2017-05-03 MED ORDER — ONDANSETRON HCL 4 MG/2ML IJ SOLN: 4.0000 mg | INTRAMUSCULAR | Status: DC | PRN

## 2017-05-03 MED ORDER — DIBUCAINE 1 % RE OINT: 1.0000 "application " | TOPICAL_OINTMENT | RECTAL | Status: DC | PRN

## 2017-05-03 MED ORDER — ACETAMINOPHEN 325 MG PO TABS
650.0000 mg | ORAL_TABLET | ORAL | Status: DC | PRN
Start: 2017-05-03 — End: 2017-05-04
  Administered 2017-05-03 – 2017-05-04 (×3): 650 mg via ORAL
  Filled 2017-05-03 (×3): qty 2

## 2017-05-03 MED ORDER — ONDANSETRON HCL 4 MG PO TABS: 4.0000 mg | ORAL_TABLET | ORAL | Status: DC | PRN

## 2017-05-03 MED ORDER — SIMETHICONE 80 MG PO CHEW: 80.0000 mg | CHEWABLE_TABLET | ORAL | Status: DC | PRN

## 2017-05-03 MED ORDER — IBUPROFEN 600 MG PO TABS
600.0000 mg | ORAL_TABLET | Freq: Four times a day (QID) | ORAL | Status: DC
  Administered 2017-05-03: 600 mg via ORAL
  Filled 2017-05-03: qty 1

## 2017-05-03 MED ORDER — WHITE PETROLATUM GEL
Status: AC
  Filled 2017-05-03: qty 5

## 2017-05-03 MED ORDER — COCONUT OIL OIL
1.0000 "application " | TOPICAL_OIL | Status: DC | PRN
  Administered 2017-05-03: 1 via TOPICAL
  Filled 2017-05-03: qty 120

## 2017-05-03 MED ORDER — FENTANYL CITRATE (PF) 100 MCG/2ML IJ SOLN
INTRAMUSCULAR | Status: DC | PRN
  Administered 2017-05-03: 100 ug via INTRAVENOUS

## 2017-05-03 MED ORDER — DIPHENHYDRAMINE HCL 25 MG PO CAPS: 25.0000 mg | ORAL_CAPSULE | Freq: Four times a day (QID) | ORAL | Status: DC | PRN

## 2017-05-03 MED ORDER — FENTANYL CITRATE (PF) 100 MCG/2ML IJ SOLN
INTRAMUSCULAR | Status: AC
  Filled 2017-05-03: qty 2

## 2017-05-03 MED ORDER — WITCH HAZEL-GLYCERIN EX PADS: 1.0000 "application " | MEDICATED_PAD | CUTANEOUS | Status: DC | PRN

## 2017-05-03 MED ORDER — BENZOCAINE-MENTHOL 20-0.5 % EX AERO
1.0000 "application " | INHALATION_SPRAY | CUTANEOUS | Status: DC | PRN
  Administered 2017-05-03: 1 via TOPICAL
  Filled 2017-05-03 (×2): qty 56

## 2017-05-03 MED ORDER — FENTANYL CITRATE (PF) 100 MCG/2ML IJ SOLN: INTRAMUSCULAR | Status: DC | PRN

## 2017-05-03 MED ORDER — PRENATAL MULTIVITAMIN CH
1.0000 | ORAL_TABLET | Freq: Every day | ORAL | Status: DC
  Administered 2017-05-03 – 2017-05-04 (×2): 1 via ORAL
  Filled 2017-05-03 (×3): qty 1

## 2017-05-03 NOTE — Progress Notes (Addendum)
Marcia AbbottRebecca Morris is a 26 y.o. G1P0 at 6266w2d by LMP admitted for induction of labor due to Post dates. Due date 04/30/2017, Low amniotic fluid. and grade 3 placenta.  Subjective:  Pt resting quietly in bed. Reports increased pelvic pressure and leakage of fluid from vagina with contractions.    Endorses good fetal movement.   Family members at bedside.   Objective:  Temp:  [96.5 F (35.8 C)-98.4 F (36.9 C)] 98.4 F (36.9 C) (06/03 0210) Pulse Rate:  [44-80] 46 (06/03 0222) Resp:  [14-16] 16 (06/02 1907) BP: (108-140)/(53-84) 120/57 (06/03 0222) SpO2:  [95 %-100 %] 98 % (06/03 0220)   I/O last 3 completed shifts: In: 488.4 [I.V.:488.4] Out: -  Total I/O In: -  Out: 1700 [Urine:1700]  Fetal Wellbeing:  Category II   UC:   regular, every two (2) to three (3) minutes  SVE: 7/100/0, vertex by ER RN   Labs:  Lab Results  Component Value Date   WBC 10.0 05/02/2017   HGB 13.0 05/02/2017   HCT 37.8 05/02/2017   MCV 86.5 05/02/2017   PLT 243 05/02/2017    Assessment:   [name] is a 26 y.o. G1P0 at 40+3 weeks here for induction of labor due to postdates, oligohydramnios, and grade 3 placenta; Rh negative, GBS positive, pitocin, epidural, SROM clear at 2358  FHR Category II  Plan:  Pitocin stopped by nursing after deceleration. Will monitor for twenty minutes, the resume pitocin titration.   Reassurance offered to patient and family.   Encouraged frequent position change and use of peanut ball.  Reviewed red flag symptoms and when to call.   Continue orders as written.   Reassess as needed.    Marcia RoyalsMichelle Fortino Morris 05/03/2017, 2:35 AM

## 2017-05-03 NOTE — Progress Notes (Signed)
FHR Category II-variable decelerations that resolve with cessation of pushing. SVE: 10/100/+1, vertex. Decision to labor down for the next 45 minutes to one (1) hour. Verbal order to restart pitocin at 4 milliunits/minute. Continue orders as written, reassess as needed.   Pt agrees with POC, verbalizes understanding.   Gunnar BullaJenkins Michelle Lawhorn, CNM

## 2017-05-03 NOTE — Progress Notes (Addendum)
Marcia AbbottRebecca Morris is a 26 y.o. G1P0 at 5268w2d by LMP admitted for induction of labor due to Post dates. Due date 04/30/2017, Low amniotic fluid. and grade 3 placenta.  Subjective:  Pt resting quietly in bed with eyes closed. Reports leakage of fluid with contractions. Denies pain.   Family members resting quietly at bedside.   Objective:  Temp:  [96.5 F (35.8 C)-98.4 F (36.9 C)] 98.4 F (36.9 C) (06/03 0210) Pulse Rate:  [44-80] 45 (06/03 0523) Resp:  [14-16] 16 (06/03 0322) BP: (108-140)/(53-84) 122/75 (06/03 0523) SpO2:  [94 %-100 %] 95 % (06/03 0525)   I/O last 3 completed shifts: In: 488.4 [I.V.:488.4] Out: -  Total I/O In: -  Out: 1700 [Urine:1700]  Fetal Wellbeing:  Category I   UC:   regular, every one (1) to four (4) minutes  SVE: 9.5/100/0, vertex by ER RN   Labs:  Lab Results  Component Value Date   WBC 10.0 05/02/2017   HGB 13.0 05/02/2017   HCT 37.8 05/02/2017   MCV 86.5 05/02/2017   PLT 243 05/02/2017    Assessment:   [name] is a 26 y.o. G1P0 at 40+3 weeks here for induction of labor due to postdates, oligohydramnios, and grade 3 placenta; Rh negative, GBS positive, pitocin, epidural, SROM clear at 2358  FHR Category I  Plan:  Reviewed red flag symptoms and when to call.   Room prepared for second stage.   Continue orders as written.   Reassess as needed.    Serafina RoyalsMichelle Francys Bolin 05/03/2017, 6:21 AM

## 2017-05-03 NOTE — Progress Notes (Signed)
Marcia AbbottRebecca Morris is a 26 y.o. G1P0 at 2247w3d by LMP admitted for induction of labor due to Post dates. Due date 04/30/2017, Low amniotic fluid. and grade 3 placenta.  Subjective:  Pt sitting in bed, crying with contractions. She reports sharp left sided abdominal pain that radiates to her back.   Family members at bedside.   Objective:  Temp:  [97.8 F (36.6 C)-98.4 F (36.9 C)] 98.4 F (36.9 C) (06/03 0715) Pulse Rate:  [44-80] 54 (06/03 0723) Resp:  [14-16] 16 (06/03 0723) BP: (108-140)/(53-84) 127/80 (06/03 0723) SpO2:  [94 %-100 %] 98 % (06/03 0720)  I/O last 3 completed shifts: In: 488.4 [I.V.:488.4] Out: 2700 [Urine:2700] No intake/output data recorded.  FHT:  FHR: 150 bpm, variability: moderate,  accelerations:  Present,  decelerations:  Absent   UC:   regular, every two (2) to three (3) minutes  SVE:   Dilation: 10 Effacement (%): 100 Station: +1, +2 Exam by:: M.Kavin Weckwerth CNM   AROM forebag  Labs: Lab Results  Component Value Date   WBC 10.0 05/02/2017   HGB 13.0 05/02/2017   HCT 37.8 05/02/2017   MCV 86.5 05/02/2017   PLT 243 05/02/2017    Assessment:  [name] is a 26 y.o. G1P0 at 40+3 weeks here for induction of labor due to postdates, oligohydramnios, and grade 3 placenta; Rh negative; GBS positive; pitocin; epidural; SROM clear at 2358, AROM forebag clear at 0811  FHR Category I  Plan:  Anesthesia to bedside for epidural management.   Anticipate NSVD.   Room prepared for second stage.   Continue orders as written. Reassess as needed.    Serafina RoyalsMichelle Cleotilde Spadaccini 05/03/2017, 8:27 AM

## 2017-05-04 ENCOUNTER — Encounter: Payer: Self-pay | Admitting: Emergency Medicine

## 2017-05-04 LAB — CBC
HCT: 33.2 % — ABNORMAL LOW (ref 35.0–47.0)
HEMOGLOBIN: 11.1 g/dL — AB (ref 12.0–16.0)
MCH: 29.2 pg (ref 26.0–34.0)
MCHC: 33.5 g/dL (ref 32.0–36.0)
MCV: 87.3 fL (ref 80.0–100.0)
Platelets: 180 10*3/uL (ref 150–440)
RBC: 3.8 MIL/uL (ref 3.80–5.20)
RDW: 14.1 % (ref 11.5–14.5)
WBC: 14.2 10*3/uL — AB (ref 3.6–11.0)

## 2017-05-04 LAB — TYPE AND SCREEN
ABO/RH(D): A NEG
ANTIBODY SCREEN: POSITIVE
UNIT DIVISION: 0
Unit division: 0

## 2017-05-04 LAB — BPAM RBC
BLOOD PRODUCT EXPIRATION DATE: 201806272359
BLOOD PRODUCT EXPIRATION DATE: 201806302359
UNIT TYPE AND RH: 9500
Unit Type and Rh: 9500

## 2017-05-04 LAB — FETAL SCREEN: Fetal Screen: NEGATIVE

## 2017-05-04 MED ORDER — WHITE PETROLATUM GEL
Status: AC
  Filled 2017-05-04: qty 10

## 2017-05-04 MED ORDER — IBUPROFEN 600 MG PO TABS: 600.0000 mg | ORAL_TABLET | Freq: Four times a day (QID) | ORAL | 0 refills | Status: DC

## 2017-05-04 MED ORDER — RHO D IMMUNE GLOBULIN 1500 UNIT/2ML IJ SOSY
300.0000 ug | PREFILLED_SYRINGE | Freq: Once | INTRAMUSCULAR | Status: AC
  Administered 2017-05-04: 300 ug via INTRAVENOUS
  Filled 2017-05-04: qty 2

## 2017-05-04 NOTE — Anesthesia Postprocedure Evaluation (Signed)
Anesthesia Post Note  Patient: Lurena JoinerRebecca XxxWitherspoon  Procedure(s) Performed: * No procedures listed *  Patient location during evaluation: Mother Baby Anesthesia Type: Epidural Level of consciousness: awake, awake and alert and oriented Pain management: pain level controlled Vital Signs Assessment: post-procedure vital signs reviewed and stable Respiratory status: spontaneous breathing Cardiovascular status: blood pressure returned to baseline Postop Assessment: no headache, no backache, no signs of nausea or vomiting and adequate PO intake Anesthetic complications: no     Last Vitals:  Vitals:   05/04/17 0042 05/04/17 0450  BP: (!) 108/45 114/62  Pulse: 96 (!) 51  Resp: 16 18  Temp: 36.7 C 36.7 C    Last Pain:  Vitals:   05/04/17 0638  TempSrc:   PainSc: Asleep                 Karoline Caldwelleana Marnita Poirier

## 2017-05-04 NOTE — Lactation Note (Signed)
This note was copied from a baby's chart. Lactation Consultation Note  Patient Name: Marcia Carlis AbbottRebecca Cona WUXLK'GToday's Date: 05/04/2017 Reason for consult: Follow-up assessment;Breast/nipple pain   Maternal Data    Feeding    LATCH Score/Interventions          Comfort (Breast/Nipple): Filling, red/small blisters or bruises, mild/mod discomfort  Problem noted: Mild/Moderate discomfort Interventions (Mild/moderate discomfort): Comfort gels (coconut oil)        Lactation Tools Discussed/Used     Consult Status Consult Status: PRN Mom complains of sore nipples due to frequent feedings throughout the night. Observed no striping or bruising/blisters. Encouraged mom to hand express colostrum on nipples, use cool gels, and use coconut oil. Also instructed parents to ensure baby's bottom lip is curled all the way out during feedings.    [name] 05/04/2017, 10:10 AM

## 2017-05-04 NOTE — Discharge Summary (Signed)
Obstetric Discharge Summary   Patient ZO:XWRUEAV:[name]  MRN: [id number]  DOB/AGE: 27/07/1991 27 y.o.   Date of Admission: 05/01/2017 [name], CNM [facility](A. Cherry, MD)  Date of Discharge:  05/04/2017 [name], CNM (A.Cherry, MD)  Admitting Diagnosis: Induction of labor at 3441w2d  Secondary Diagnosis: Oligohydramnios  Mode of Delivery: normal spontaneous vaginal delivery     Discharge Diagnosis: No other diagnosis   Intrapartum Procedures: Atificial rupture of membranes, epidural, GBS prophylaxis, laceration labial, pitocin augmentation and bedside ultrasound   Post partum procedures: rhogam  Complications: bilater labial laceration  Brief Hospital Course   [name] is a G1P0 who had a SVD on 05/03/2017;  for further details of this birth, please refer to the delivey note.  Patient had an uncomplicated postpartum course.  By time of discharge on PPD#1, her pain was controlled on oral pain medications; she had appropriate lochia and was ambulating, voiding without difficulty and tolerating regular diet.  She was deemed stable for discharge to home.    Labs:  CBC Latest Ref Rng & Units 05/04/2017 05/02/2017 02/06/2017  WBC 3.6 - 11.0 K/uL 14.2(H) 10.0 -  Hemoglobin 12.0 - 16.0 g/dL 11.1(L) 13.0 12.5  Hematocrit 35.0 - 47.0 % 33.2(L) 37.8 36.1  Platelets 150 - 440 K/uL 180 243 -   A NEG  Physical exam:   General: alert and no distress  Breasts: soft, non-tender, nipples intact free of bleeding and cracks  Lochia: appropriate  Abdomen: soft, non-tender  Uterine Fundus: firm  Laceration: healing well  Extremities: No evidence of DVT seen on physical exam. No lower extremity edema.  Discharge Instructions: Per After Visit Summary.  Activity: Advance as tolerated. Pelvic rest for 6 weeks.  Also refer to After Visit Summary  Diet: Regular  Medications:  Allergies as of 05/04/2017   No Known Allergies     Medication List    TAKE these  medications   ibuprofen 600 MG tablet Commonly known as:  ADVIL,MOTRIN Take 1 tablet (600 mg total) by mouth every 6 (six) hours.   prenatal vitamin w/FE, FA 27-1 MG Tabs tablet Take 1 tablet by mouth daily at 12 noon.   ranitidine 150 MG tablet Commonly known as:  ZANTAC Take 150 mg by mouth 2 (two) times daily.      Outpatient follow up:  Follow-up Information    [name], CNM. Schedule an appointment as soon as possible for a visit in 6 week(s).   Specialties:  Certified Nurse Midwife, Obstetrics and Gynecology, Radiology Why:  Call to schedule a 6 week PPV Contact information: [address] [place] [address] [phone]          Postpartum contraception: oral progesterone-only contraceptive  Discharged Condition: stable  Discharged to: home   Newborn Data: Disposition:home with mother  Apgars: APGAR (1 MIN): 8   APGAR (5 MINS): 9   APGAR (10 MINS):    Baby Feeding: Breast    [name], CNM

## 2017-05-04 NOTE — Progress Notes (Signed)
Reviewed all patients discharge instructions and handouts regarding postpartum bleeding, no intercourse for 6 weeks, signs and symptoms of mastitis and postpartum bleu's. Reviewed discharge instructions for newborn regarding proper cord care, how and when to bathe the newborn, nail care, proper way to take the baby's temperature, along with safe sleep. All questions have been answered at this time. Patient discharged via wheelchair with nurse tech.  

## 2017-05-04 NOTE — Progress Notes (Signed)
Post Partum Day 1/2  Subjective:  Pt sitting in bed, nursing infant. Father of baby at bedside.   Reports uterine cramping while breastfeeding that is tolerable. She was up breastfeeding the baby several times last night. Desires discharge home this evening.   Denies difficulty breathing or respiratory distress, fever, chest pain, abdominal pain, excessive vaginal bleeding, and leg pain or swelling.   Objective:   Blood pressure 112/65, pulse 70, temperature 98.7 F (37.1 C), temperature source Oral, resp. rate 20, height 5\' 6"  (1.676 m), weight 226 lb (102.5 kg), last menstrual period 07/24/2016, SpO2 99 %.  Physical Exam:   General: alert and cooperative  Lochia: appropriate  Uterine Fundus: firm  Laceration: healing well  DVT Evaluation: No evidence of DVT seen on physical exam.   Recent Labs  05/02/17 0208 05/04/17 0534  HGB 13.0 11.1*  HCT 37.8 33.2*    Assessment:  S/P spontaneous vaginal delivery on 05/03/2017  Rh negative Breastfeeding  Plan:  Reviewed routine postpartum instructions including self care, breast care, perineal/laceraction care, baby blues, postpartum depression, and postpartum psychosis.   Discussed red flag symptoms and when to call.   Continue orders as written.   Anticipate discharge this afternoon.    Gunnar BullaJenkins Michelle Aubrina Nieman, CNM

## 2017-05-05 ENCOUNTER — Telehealth: Payer: Self-pay | Admitting: Certified Nurse Midwife

## 2017-05-05 LAB — RHOGAM INJECTION: Unit division: 0

## 2017-05-08 NOTE — Telephone Encounter (Signed)
error 

## 2017-05-08 NOTE — Telephone Encounter (Signed)
Error

## 2017-06-08 ENCOUNTER — Encounter: Payer: Self-pay | Admitting: Certified Nurse Midwife

## 2017-06-08 ENCOUNTER — Ambulatory Visit (INDEPENDENT_AMBULATORY_CARE_PROVIDER_SITE_OTHER): Payer: Medicaid Other | Admitting: Certified Nurse Midwife

## 2017-06-08 MED ORDER — NORETHINDRONE 0.35 MG PO TABS: 1.0000 | ORAL_TABLET | Freq: Every day | ORAL | 0 refills | Status: DC

## 2017-06-08 NOTE — Patient Instructions (Signed)
Norethindrone tablets (contraception) What is this medicine? NORETHINDRONE (nor eth IN drone) is an oral contraceptive. The product contains a female hormone known as a progestin. It is used to prevent pregnancy. This medicine may be used for other purposes; ask your health care provider or pharmacist if you have questions. COMMON BRAND NAME(S): Camila, Deblitane 28-Day, Errin, Heather, Jencycla, Jolivette, Lyza, Nor-QD, Nora-BE, Norlyroc, Ortho Micronor, Sharobel 28-Day What should I tell my health care provider before I take this medicine? They need to know if you have any of these conditions: -blood vessel disease or blood clots -breast, cervical, or vaginal cancer -diabetes -heart disease -kidney disease -liver disease -mental depression -migraine -seizures -stroke -vaginal bleeding -an unusual or allergic reaction to norethindrone, other medicines, foods, dyes, or preservatives -pregnant or trying to get pregnant -breast-feeding How should I use this medicine? Take this medicine by mouth with a glass of water. You may take it with or without food. Follow the directions on the prescription label. Take this medicine at the same time each day and in the order directed on the package. Do not take your medicine more often than directed. Contact your pediatrician regarding the use of this medicine in children. Special care may be needed. This medicine has been used in female children who have started having menstrual periods. A patient package insert for the product will be given with each prescription and refill. Read this sheet carefully each time. The sheet may change frequently. Overdosage: If you think you have taken too much of this medicine contact a poison control center or emergency room at once. NOTE: This medicine is only for you. Do not share this medicine with others. What if I miss a dose? Try not to miss a dose. Every time you miss a dose or take a dose late your chance of  pregnancy increases. When 1 pill is missed (even if only 3 hours late), take the missed pill as soon as possible and continue taking a pill each day at the regular time (use a back up method of birth control for the next 48 hours). If more than 1 dose is missed, use an additional birth control method for the rest of your pill pack until menses occurs. Contact your health care professional if more than 1 dose has been missed. What may interact with this medicine? Do not take this medicine with any of the following medications: -amprenavir or fosamprenavir -bosentan This medicine may also interact with the following medications: -antibiotics or medicines for infections, especially rifampin, rifabutin, rifapentine, and griseofulvin, and possibly penicillins or tetracyclines -aprepitant -barbiturate medicines, such as phenobarbital -carbamazepine -felbamate -modafinil -oxcarbazepine -phenytoin -ritonavir or other medicines for HIV infection or AIDS -St. John's wort -topiramate This list may not describe all possible interactions. Give your health care provider a list of all the medicines, herbs, non-prescription drugs, or dietary supplements you use. Also tell them if you smoke, drink alcohol, or use illegal drugs. Some items may interact with your medicine. What should I watch for while using this medicine? Visit your doctor or health care professional for regular checks on your progress. You will need a regular breast and pelvic exam and Pap smear while on this medicine. Use an additional method of birth control during the first cycle that you take these tablets. If you have any reason to think you are pregnant, stop taking this medicine right away and contact your doctor or health care professional. If you are taking this medicine for hormone related problems, it   may take several cycles of use to see improvement in your condition. This medicine does not protect you against HIV infection (AIDS)  or any other sexually transmitted diseases. What side effects may I notice from receiving this medicine? Side effects that you should report to your doctor or health care professional as soon as possible: -breast tenderness or discharge -pain in the abdomen, chest, groin or leg -severe headache -skin rash, itching, or hives -sudden shortness of breath -unusually weak or tired -vision or speech problems -yellowing of skin or eyes Side effects that usually do not require medical attention (report to your doctor or health care professional if they continue or are bothersome): -changes in sexual desire -change in menstrual flow -facial hair growth -fluid retention and swelling -headache -irritability -nausea -weight gain or loss This list may not describe all possible side effects. Call your doctor for medical advice about side effects. You may report side effects to FDA at 1-800-FDA-1088. Where should I keep my medicine? Keep out of the reach of children. Store at room temperature between 15 and 30 degrees C (59 and 86 degrees F). Throw away any unused medicine after the expiration date. NOTE: This sheet is a summary. It may not cover all possible information. If you have questions about this medicine, talk to your doctor, pharmacist, or health care provider.  2018 Elsevier/Gold Standard (2012-08-06 16:41:35)  

## 2017-06-08 NOTE — Progress Notes (Signed)
Subjective:    [name] is a 26 y.o. [id number] Caucasian female who presents for a postpartum visit. She is 5 weeks postpartum following a spontaneous vaginal delivery at 40+1 gestational weeks. Anesthesia: epidural. I have fully reviewed the prenatal and intrapartum course.  Postpartum course has been uncomplcated. Baby's course has been uncomplicated.   Baby is feeding by breast. Bleeding no bleeding. Bowel function is normal. Bladder function is normal.   Patient is not sexually active. Contraception method is abstinence. Postpartum depression screening: negative. Score 1.  Last pap 2011and was negative.  The following portions of the patient's history were reviewed and updated as appropriate: allergies, current medications, past medical history, past surgical history and problem list.  Review of Systems  Pertinent items are noted in HPI.   Objective:   BP 112/72   Pulse 70   Ht 5\' 5"  (1.651 m)   Wt 214 lb 12.8 oz (97.4 kg)   LMP  (LMP Unknown)   BMI 35.74 kg/m   General:  alert, cooperative and no distress   Breasts:  deferred, no complaints  Lungs: clear to auscultation bilaterally  Heart:  regular rate and rhythm  Abdomen: soft, nontender   Vulva: normal  Vagina: normal vagina  Cervix:  closed  Corpus: Well-involuted  Adnexa:  Non-palpable        [facility]  Decreased Interest 0  Down, Depressed, Hopeless 0  PHQ - 2 Score 0  Altered sleeping 0  Tired, decreased energy 1  Change in appetite 0  Feeling bad or failure about yourself  0  Trouble concentrating 0  Moving slowly or fidgety/restless 0  Suicidal thoughts 0  PHQ-9 Score 1   Assessment:   Postpartum exam Five (5) wks s/p vaginal birth Breastfeeding Depression screening Contraception counseling   Plan:   Rx Camila, see orders.   Encouraged to continue prenatal vitamin while breastfeeding.  Follow up in: 3 months for Annual exam or earlier if  needed   Gunnar BullaJenkins Michelle Sybil Shrader, CNM

## 2017-07-05 ENCOUNTER — Encounter: Payer: Self-pay | Admitting: Certified Nurse Midwife

## 2017-07-09 ENCOUNTER — Telehealth: Payer: Self-pay

## 2017-07-09 NOTE — Telephone Encounter (Signed)
Left message for pt that she could have fruits and sugar. Was told to decrease this when talking with triage nurse. To contact office for any further questions.

## 2017-08-31 ENCOUNTER — Encounter: Payer: Medicaid Other | Admitting: Certified Nurse Midwife

## 2017-09-22 ENCOUNTER — Encounter: Payer: Self-pay | Admitting: Certified Nurse Midwife

## 2017-11-05 ENCOUNTER — Encounter: Payer: Medicaid Other | Admitting: Certified Nurse Midwife

## 2017-11-12 ENCOUNTER — Encounter: Payer: Self-pay | Admitting: Certified Nurse Midwife

## 2017-11-12 ENCOUNTER — Ambulatory Visit (INDEPENDENT_AMBULATORY_CARE_PROVIDER_SITE_OTHER): Payer: Medicaid Other | Admitting: Certified Nurse Midwife

## 2017-11-12 VITALS — BP 131/79 | HR 90 | Ht 66.0 in | Wt 237.1 lb

## 2017-11-12 DIAGNOSIS — M25552 Pain in left hip: Secondary | ICD-10-CM

## 2017-11-12 DIAGNOSIS — R6882 Decreased libido: Secondary | ICD-10-CM | POA: Diagnosis not present

## 2017-11-12 DIAGNOSIS — Z0001 Encounter for general adult medical examination with abnormal findings: Secondary | ICD-10-CM | POA: Diagnosis not present

## 2017-11-12 DIAGNOSIS — Z01419 Encounter for gynecological examination (general) (routine) without abnormal findings: Secondary | ICD-10-CM

## 2017-11-12 DIAGNOSIS — R21 Rash and other nonspecific skin eruption: Secondary | ICD-10-CM | POA: Diagnosis not present

## 2017-11-12 DIAGNOSIS — Z124 Encounter for screening for malignant neoplasm of cervix: Secondary | ICD-10-CM | POA: Diagnosis not present

## 2017-11-12 DIAGNOSIS — G5602 Carpal tunnel syndrome, left upper limb: Secondary | ICD-10-CM | POA: Diagnosis not present

## 2017-11-12 MED ORDER — NYSTATIN-TRIAMCINOLONE 100000-0.1 UNIT/GM-% EX CREA: 1.0000 "application " | TOPICAL_CREAM | Freq: Two times a day (BID) | CUTANEOUS | 0 refills | Status: DC

## 2017-11-12 NOTE — Patient Instructions (Addendum)
Preventive Care 18-39 Years, Female Preventive care refers to lifestyle choices and visits with your health care provider that can promote health and wellness. What does preventive care include?  A yearly physical exam. This is also called an annual well check.  Dental exams once or twice a year.  Routine eye exams. Ask your health care provider how often you should have your eyes checked.  Personal lifestyle choices, including: ? Daily care of your teeth and gums. ? Regular physical activity. ? Eating a healthy diet. ? Avoiding tobacco and drug use. ? Limiting alcohol use. ? Practicing safe sex. ? Taking vitamin and mineral supplements as recommended by your health care provider. What happens during an annual well check? The services and screenings done by your health care provider during your annual well check will depend on your age, overall health, lifestyle risk factors, and family history of disease. Counseling Your health care provider may ask you questions about your:  Alcohol use.  Tobacco use.  Drug use.  Emotional well-being.  Home and relationship well-being.  Sexual activity.  Eating habits.  Work and work Statistician.  Method of birth control.  Menstrual cycle.  Pregnancy history.  Screening You may have the following tests or measurements:  Height, weight, and BMI.  Diabetes screening. This is done by checking your blood sugar (glucose) after you have not eaten for a while (fasting).  Blood pressure.  Lipid and cholesterol levels. These may be checked every 5 years starting at age 66.  Skin check.  Hepatitis C blood test.  Hepatitis B blood test.  Sexually transmitted disease (STD) testing.  BRCA-related cancer screening. This may be done if you have a family history of breast, ovarian, tubal, or peritoneal cancers.  Pelvic exam and Pap test. This may be done every 3 years starting at age 40. Starting at age 59, this may be done every 5  years if you have a Pap test in combination with an HPV test.  Discuss your test results, treatment options, and if necessary, the need for more tests with your health care provider. Vaccines Your health care provider may recommend certain vaccines, such as:  Influenza vaccine. This is recommended every year.  Tetanus, diphtheria, and acellular pertussis (Tdap, Td) vaccine. You may need a Td booster every 10 years.  Varicella vaccine. You may need this if you have not been vaccinated.  HPV vaccine. If you are 69 or younger, you may need three doses over 6 months.  Measles, mumps, and rubella (MMR) vaccine. You may need at least one dose of MMR. You may also need a second dose.  Pneumococcal 13-valent conjugate (PCV13) vaccine. You may need this if you have certain conditions and were not previously vaccinated.  Pneumococcal polysaccharide (PPSV23) vaccine. You may need one or two doses if you smoke cigarettes or if you have certain conditions.  Meningococcal vaccine. One dose is recommended if you are age 27-21 years and a first-year college student living in a residence hall, or if you have one of several medical conditions. You may also need additional booster doses.  Hepatitis A vaccine. You may need this if you have certain conditions or if you travel or work in places where you may be exposed to hepatitis A.  Hepatitis B vaccine. You may need this if you have certain conditions or if you travel or work in places where you may be exposed to hepatitis B.  Haemophilus influenzae type b (Hib) vaccine. You may need this if  you have certain risk factors.  Talk to your health care provider about which screenings and vaccines you need and how often you need them. This information is not intended to replace advice given to you by your health care provider. Make sure you discuss any questions you have with your health care provider. Document Released: 01/13/2002 Document Revised: 08/06/2016  Document Reviewed: 09/18/2015 Elsevier Interactive Patient Education  2017 Elsevier Inc.  Sciatica Sciatica is pain, numbness, weakness, or tingling along your sciatic nerve. The sciatic nerve starts in the lower back and goes down the back of each leg. Sciatica happens when this nerve is pinched or has pressure put on it. Sciatica usually goes away on its own or with treatment. Sometimes, sciatica may keep coming back (recur). Follow these instructions at home: Medicines Take over-the-counter and prescription medicines only as told by your doctor. Do not drive or use heavy machinery while taking prescription pain medicine. Managing pain If directed, put ice on the affected area. Put ice in a plastic bag. Place a towel between your skin and the bag. Leave the ice on for 20 minutes, 2-3 times a day. After icing, apply heat to the affected area before you exercise or as often as told by your doctor. Use the heat source that your doctor tells you to use, such as a moist heat pack or a heating pad. Place a towel between your skin and the heat source. Leave the heat on for 20-30 minutes. Remove the heat if your skin turns bright red. This is especially important if you are unable to feel pain, heat, or cold. You may have a greater risk of getting burned. Activity Return to your normal activities as told by your doctor. Ask your doctor what activities are safe for you. Avoid activities that make your sciatica worse. Take short rests during the day. Rest in a lying or standing position. This is usually better than sitting to rest. When you rest for a long time, do some physical activity or stretching between periods of rest. Avoid sitting for a long time without moving. Get up and move around at least one time each hour. Exercise and stretch regularly, as told by your doctor. Do not lift anything that is heavier than 10 lb (4.5 kg) while you have symptoms of sciatica. Avoid lifting heavy things  even when you do not have symptoms. Avoid lifting heavy things over and over. When you lift objects, always lift in a way that is safe for your body. To do this, you should: Bend your knees. Keep the object close to your body. Avoid twisting. General instructions Use good posture. Avoid leaning forward when you are sitting. Avoid hunching over when you are standing. Stay at a healthy weight. Wear comfortable shoes that support your feet. Avoid wearing high heels. Avoid sleeping on a mattress that is too soft or too hard. You might have less pain if you sleep on a mattress that is firm enough to support your back. Keep all follow-up visits as told by your doctor. This is important. Contact a doctor if: You have pain that: Wakes you up when you are sleeping. Gets worse when you lie down. Is worse than the pain you have had in the past. Lasts longer than 4 weeks. You lose weight for without trying. Get help right away if: You cannot control when you pee (urinate) or poop (have a bowel movement). You have weakness in any of these areas and it gets worse. Lower back.  Lower belly (pelvis). Butt (buttocks). Legs. You have redness or swelling of your back. You have a burning feeling when you pee. This information is not intended to replace advice given to you by your health care provider. Make sure you discuss any questions you have with your health care provider. Document Released: 08/26/2008 Document Revised: 04/24/2016 Document Reviewed: 07/27/2015 Elsevier Interactive Patient Education  2018 [place]. Nystatin; Triamcinolone cream or ointment What is this medicine? NYSTATIN; TRIAMCINOLONE (nye STAT in; trye am SIN oh lone) is a combination of an antifungal medicine and a steroid. It is used to treat certain kinds of fungal or yeast infections of the skin. This medicine may be used for other purposes; ask your health care provider or pharmacist if you have questions. COMMON BRAND  NAME(S): Myco-Triacet-II, Mycogen-II, Mycolog II, Mytrex, N.T.A. What should I tell my health care provider before I take this medicine? They need to know if you have any of these conditions: -large areas of burned or damaged skin -skin wasting or thinning -peripheral vascular disease or poor circulation -an unusual or allergic reaction to nystatin, triamcinolone, other corticosteroids, other medicines, foods, dyes, or preservatives -pregnant or trying to get pregnant -breast-feeding How should I use this medicine? This medicine is for external use only. Do not take by mouth. Follow the directions on the prescription label. Wash your hands before and after use. If treating hand or nail infections, wash hands before use only. Apply a thin layer of this medicine to the affected area and rub in gently. Do not use on healthy skin or over large areas of skin. Do not get this medicine in your eyes. If you do, rinse out with plenty of cool tap water. When applying to the groin area, apply a limited amount and do not use for longer than 2 weeks unless directed to by your doctor or health care professional. Do not cover or wrap the treated area with an airtight bandage (such as a plastic bandage). Use the full course of treatment prescribed, even if you think the infection is getting better. Use at regular intervals. Do not use your medicine more often than directed. Do not use this medicine for any condition other than the one for which it was prescribed. Talk to your pediatrician regarding the use of this medicine in children. While this drug may be prescribed for selected conditions, precautions do apply. Children being treated in the diaper area should not wear tight-fitting diapers or plastic pants. Elderly patients are more likely to have damaged skin through aging, and this may increase side effects. This medicine should only be used for brief periods and infrequently in older patients. Overdosage: If you  think you have taken too much of this medicine contact a poison control center or emergency room at once. NOTE: This medicine is only for you. Do not share this medicine with others. What if I miss a dose? If you miss a dose, use it as soon as you can. If it is almost time for your next dose, use only that dose. Do not use double or extra doses. What may interact with this medicine? Interactions are not expected. Do not use any other skin products on the affected area without telling your doctor or health care professional. This list may not describe all possible interactions. Give your health care provider a list of all the medicines, herbs, non-prescription drugs, or dietary supplements you use. Also tell them if you smoke, drink alcohol, or use illegal drugs. Some  items may interact with your medicine. What should I watch for while using this medicine? Tell your doctor or health care professional if your symptoms do not start to get better within 1 week when treating the groin area or within 2 weeks when treating the feet. . Tell your doctor or health care professional if you develop sores or blisters that do not heal properly. If your skin infection returns after stopping this medicine, contact your doctor or health care professional. If you are using this medicine to treat an infection in the groin area, do not wear underwear that is tight-fitting or made from synthetic fibers such as rayon or nylon. Instead, wear loose-fitting, cotton underwear. Also dry the area completely after bathing. What side effects may I notice from receiving this medicine? Side effects that you should report to your doctor or health care professional as soon as possible: -burning or itching of the skin -dark red spots on the skin -loss of feeling on skin -painful, red, pus-filled blisters in hair follicles -skin infection -thinning of the skin or sunburn: more likely if applied to the face Side effects that usually  do not require medical attention (report to your doctor or health care professional if they continue or are bothersome): -dry or peeling skin -skin irritation This list may not describe all possible side effects. Call your doctor for medical advice about side effects. You may report side effects to FDA at 1-800-FDA-1088. Where should I keep my medicine? Keep out of the reach of children. Store at room temperature between 15 and 30 degrees C (59 and 86 degrees F). Do not freeze. Throw away any unused medicine after the expiration date. NOTE: This sheet is a summary. It may not cover all possible information. If you have questions about this medicine, talk to your doctor, pharmacist, or health care provider.  2018 Elsevier/Gold Standard (2008-06-09 17:29:26)

## 2017-11-12 NOTE — Progress Notes (Signed)
papANNUAL PREVENTATIVE CARE GYN  ENCOUNTER NOTE  Subjective:       [name] is a 26 y.o. [id number] female here for a routine annual gynecologic exam.  Current complaints: 1. Left hip pain 2. Rash to lower bilateral lower ankles 3. Left carpal tunnel syndrome flare 4. Decreased libido  Reports intermittent left hip pain since discharge from hospital after birth. Pain increases when lying or resting on opposite side. Bilateral lower ankle rash-dry, flaky, itchy. No relief with home treatment measures. Left carpal tunnel syndrome flare that is most noticeable when holding infant. Decreased libido and sexual desire since birth and while breastfeeding.   Denies difficulty breathing or respiratory distress, chest pain, abdominal pain, vaginal bleeding, dysuria, and leg pain or swelling.    Gynecologic History  No LMP recorded. Breastfeeding.   Contraception: oral progesterone-only contraceptive   Last Pap: 2011. Results were: normal  Obstetric History  OB History  Gravida Para Term Preterm AB Living  1 1 1     1   SAB TAB Ectopic Multiple Live Births          1    # Outcome Date GA Lbr Len/2nd Weight Sex Delivery Anes PTL Lv  1 Term 2018   6 lb 2.4 oz (2.79 kg)  Vag-Spont   LIV      History reviewed. No pertinent past medical history.  Past Surgical History:  Procedure Laterality Date  . WISDOM TOOTH EXTRACTION      Current Outpatient Medications on File Prior to Visit  Medication Sig Dispense Refill  . norethindrone (MICRONOR,CAMILA,ERRIN) 0.35 MG tablet Take 1 tablet (0.35 mg total) by mouth daily. 12 Package 0  . prenatal vitamin w/FE, FA (PRENATAL 1 + 1) 27-1 MG TABS tablet Take 1 tablet by mouth daily at 12 noon.     No current facility-administered medications on file prior to visit.     No Known Allergies  Social History   Socioeconomic History  . Marital status: Married    Spouse name: Not on file  . Number of children: Not on file  . Years of  education: Not on file  . Highest education level: Not on file  Social Needs  . Financial resource strain: Not on file  . Food insecurity - worry: Not on file  . Food insecurity - inability: Not on file  . Transportation needs - medical: Not on file  . Transportation needs - non-medical: Not on file  Occupational History  . Not on file  Tobacco Use  . Smoking status: Former Smoker    Last attempt to quit: 11/24/2014    Years since quitting: 2.9  . Smokeless tobacco: Never Used  Substance and Sexual Activity  . Alcohol use: No  . Drug use: No  . Sexual activity: Yes    Partners: Male    Birth control/protection: Pill  Other Topics Concern  . Not on file  Social History Narrative  . Not on file    Family History  Problem Relation Age of Onset  . Hypertension Mother   . Cancer Mother        SKIN CANCER  . Hypertension Father   . Cancer Father        PROSTATE CANCER  . Seizures Brother   . Cancer Maternal Grandmother        BRAIN TUMOR    The following portions of the patient's history were reviewed and updated as appropriate: allergies, current medications, past family history, past medical history, past social  history, past surgical history and problem list.  Review of Systems  ROS negative except as noted above. Information obtained from patient.    Objective:   BP 131/79   Pulse 90   Ht 5\' 6"  (1.676 m)   Wt 237 lb 1.6 oz (107.5 kg)   Breastfeeding? Yes   BMI 38.27 kg/m   CONSTITUTIONAL: Well-developed, well-nourished female  in no acute distress.   PSYCHIATRIC: Normal mood and affect. Normal behavior. Normal judgment and thought content.  NEUROLGIC: Alert and oriented to person, place, and time. Normal muscle tone coordination. No cranial nerve deficit noted.  HENT:  Normocephalic, atraumatic, External right and left ear normal. Oropharynx is clear and moist  EYES: Conjunctivae and EOM are normal. Pupils are equal, round, and reactive to light. No  scleral icterus.   NECK: Normal range of motion, supple, no masses.  Normal thyroid.   SKIN: Skin is warm and dry. No rash noted. Not diaphoretic. No erythema. No pallor. Flaky, dry, red patches with clear drainage to bilateral ankles-culture collected.  CARDIOVASCULAR: Normal heart rate noted, regular rhythm, no murmur.  RESPIRATORY: Clear to auscultation bilaterally. Effort and breath sounds normal, no problems with respiration noted.  BREASTS: Symmetric in size. No masses, skin changes, nipple drainage, or lymphadenopathy.  ABDOMEN: Soft, normal bowel sounds, no distention noted.  No tenderness, rebound or guarding.   PELVIC:  External Genitalia: Normal  Vagina: Normal  Cervix: Normal, Pap collected  Uterus: Normal  Adnexa: Normal   MUSCULOSKELETAL: Normal range of motion. No tenderness.  No cyanosis, clubbing, or edema.  2+ distal pulses.  LYMPHATIC: No Axillary, Supraclavicular, or Inguinal Adenopathy.  Assessment:   Annual gynecologic examination 26 y.o.   Contraception: oral progesterone-only contraceptive   Obesity 1   Problem List Items Addressed This Visit    None    Visit Diagnoses    Well woman exam with routine gynecological exam    -  Primary   Relevant Orders   Pap IG, rfx HPV ASCU,16/18   Left hip pain       Relevant Orders   Ambulatory referral to Physical Therapy   Rash of body       Relevant Orders   Anaerobic and Aerobic Culture   Left carpal tunnel syndrome       Relevant Orders   Ambulatory referral to Physical Therapy   Screening for cervical cancer       Relevant Orders   Pap IG, rfx HPV ASCU,16/18      Plan:   Pap: Pap, Reflex if ASCUS, see orders.  Labs: Anaerobic and aerobic culture, see orders.   Routine preventative health maintenance measures emphasized: Exercise/Diet/Weight control, Tobacco Warnings, Alcohol/Substance use risks and Stress Management.  Discussed home treatment measures for lip pain and carpal tunnel and  handout provided. Referral to PT, see orders.   Rx: Mycolog, see orders.   Reviewed red flag symptoms and when to call.   RTC x 1 year for annual exam or sooner if needed.    Gunnar BullaJenkins Michelle Sirius Woodford, CNM [facility], [facility]

## 2017-11-16 LAB — GENITAL CULTURE

## 2017-11-16 LAB — SPECIMEN STATUS REPORT

## 2017-11-17 ENCOUNTER — Other Ambulatory Visit: Payer: Self-pay | Admitting: Certified Nurse Midwife

## 2017-11-17 ENCOUNTER — Encounter: Payer: Self-pay | Admitting: Certified Nurse Midwife

## 2017-11-17 MED ORDER — SULFAMETHOXAZOLE-TRIMETHOPRIM 800-160 MG PO TABS: 1.0000 | ORAL_TABLET | Freq: Two times a day (BID) | ORAL | 0 refills | Status: AC

## 2017-11-20 ENCOUNTER — Other Ambulatory Visit: Payer: Self-pay | Admitting: Certified Nurse Midwife

## 2017-11-20 MED ORDER — ALBUTEROL SULFATE HFA 108 (90 BASE) MCG/ACT IN AERS: 2.0000 | INHALATION_SPRAY | Freq: Four times a day (QID) | RESPIRATORY_TRACT | 2 refills | Status: DC | PRN

## 2017-11-25 LAB — PAP IG, RFX HPV ASCU,16/18: PAP SMEAR COMMENT: 0

## 2017-12-03 ENCOUNTER — Ambulatory Visit (INDEPENDENT_AMBULATORY_CARE_PROVIDER_SITE_OTHER): Payer: Medicaid Other | Admitting: Certified Nurse Midwife

## 2017-12-03 ENCOUNTER — Encounter: Payer: Self-pay | Admitting: Certified Nurse Midwife

## 2017-12-03 VITALS — BP 126/84 | HR 86 | Ht 66.0 in | Wt 237.1 lb

## 2017-12-03 DIAGNOSIS — A4902 Methicillin resistant Staphylococcus aureus infection, unspecified site: Secondary | ICD-10-CM

## 2017-12-03 DIAGNOSIS — F53 Postpartum depression: Secondary | ICD-10-CM

## 2017-12-03 DIAGNOSIS — O99345 Other mental disorders complicating the puerperium: Secondary | ICD-10-CM

## 2017-12-03 MED ORDER — SERTRALINE HCL 50 MG PO TABS: 50.0000 mg | ORAL_TABLET | Freq: Every day | ORAL | 1 refills | Status: DC

## 2017-12-03 MED ORDER — CLINDAMYCIN HCL 300 MG PO CAPS: 300.0000 mg | ORAL_CAPSULE | Freq: Four times a day (QID) | ORAL | 0 refills | Status: AC

## 2017-12-03 NOTE — Progress Notes (Signed)
Patient comes in today to discuss postpartum depression. 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 However, she reports that she is not back to her "normal self."   She also c/o rash on her right ankle. She has completed all abx and reports that it is not any better.

## 2017-12-03 NOTE — Patient Instructions (Signed)
Clindamycin capsules What is this medicine? CLINDAMYCIN (Hobson sin) is a lincosamide antibiotic. It is used to treat certain kinds of bacterial infections. It will not work for colds, flu, or other viral infections. This medicine may be used for other purposes; ask your health care provider or pharmacist if you have questions. COMMON BRAND NAME(S): Cleocin What should I tell my health care provider before I take this medicine? They need to know if you have any of these conditions: -kidney disease -liver disease -stomach problems like colitis -an unusual or allergic reaction to clindamycin, lincomycin, or other medicines, foods, dyes like tartrazine or preservatives -pregnant or trying to get pregnant -breast-feeding How should I use this medicine? Take this medicine by mouth with a full glass of water. Follow the directions on the prescription label. You can take this medicine with food or on an empty stomach. If the medicine upsets your stomach, take it with food. Take your medicine at regular intervals. Do not take your medicine more often than directed. Take all of your medicine as directed even if you think your are better. Do not skip doses or stop your medicine early. Talk to your pediatrician regarding the use of this medicine in children. Special care may be needed. Overdosage: If you think you have taken too much of this medicine contact a poison control center or emergency room at once. NOTE: This medicine is only for you. Do not share this medicine with others. What if I miss a dose? If you miss a dose, take it as soon as you can. If it is almost time for your next dose, take only that dose. Do not take double or extra doses. What may interact with this medicine? -birth control pills -erythromycin -medicines that relax muscles for surgery -rifampin This list may not describe all possible interactions. Give your health care provider a list of all the medicines, herbs,  non-prescription drugs, or dietary supplements you use. Also tell them if you smoke, drink alcohol, or use illegal drugs. Some items may interact with your medicine. What should I watch for while using this medicine? Tell your doctor or healthcare professional if your symptoms do not start to get better or if they get worse. Do not treat diarrhea with over the counter products. Contact your doctor if you have diarrhea that lasts more than 2 days or if it is severe and watery. What side effects may I notice from receiving this medicine? Side effects that you should report to your doctor or health care professional as soon as possible: -allergic reactions like skin rash, itching or hives, swelling of the face, lips, or tongue -dark urine -pain on swallowing -redness, blistering, peeling or loosening of the skin, including inside the mouth -unusual bleeding or bruising -unusually weak or tired -yellowing of eyes or skin Side effects that usually do not require medical attention (report to your doctor or health care professional if they continue or are bothersome): -diarrhea -itching in the rectal or genital area -joint pain -nausea, vomiting -stomach pain This list may not describe all possible side effects. Call your doctor for medical advice about side effects. You may report side effects to FDA at 1-800-FDA-1088. Where should I keep my medicine? Keep out of the reach of children. Store at room temperature between 20 and 25 degrees C (68 and 77 degrees F). Throw away any unused medicine after the expiration date. NOTE: This sheet is a summary. It may not cover all possible information. If you  have questions about this medicine, talk to your doctor, pharmacist, or health care provider.  2018 Elsevier/Gold Standard (2016-02-20 16:34:00) Sertraline tablets What is this medicine? SERTRALINE (SER tra leen) is used to treat depression. It may also be used to treat obsessive compulsive disorder,  panic disorder, post-trauma stress, premenstrual dysphoric disorder (PMDD) or social anxiety. This medicine may be used for other purposes; ask your health care provider or pharmacist if you have questions. COMMON BRAND NAME(S): Zoloft What should I tell my health care provider before I take this medicine? They need to know if you have any of these conditions: -bleeding disorders -bipolar disorder or a family history of bipolar disorder -glaucoma -heart disease -high blood pressure -history of irregular heartbeat -history of low levels of calcium, magnesium, or potassium in the blood -if you often drink alcohol -liver disease -receiving electroconvulsive therapy -seizures -suicidal thoughts, plans, or attempt; a previous suicide attempt by you or a family member -take medicines that treat or prevent blood clots -thyroid disease -an unusual or allergic reaction to sertraline, other medicines, foods, dyes, or preservatives -pregnant or trying to get pregnant -breast-feeding How should I use this medicine? Take this medicine by mouth with a glass of water. Follow the directions on the prescription label. You can take it with or without food. Take your medicine at regular intervals. Do not take your medicine more often than directed. Do not stop taking this medicine suddenly except upon the advice of your doctor. Stopping this medicine too quickly may cause serious side effects or your condition may worsen. A special MedGuide will be given to you by the pharmacist with each prescription and refill. Be sure to read this information carefully each time. Talk to your pediatrician regarding the use of this medicine in children. While this drug may be prescribed for children as young as 7 years for selected conditions, precautions do apply. Overdosage: If you think you have taken too much of this medicine contact a poison control center or emergency room at once. NOTE: This medicine is only for  you. Do not share this medicine with others. What if I miss a dose? If you miss a dose, take it as soon as you can. If it is almost time for your next dose, take only that dose. Do not take double or extra doses. What may interact with this medicine? Do not take this medicine with any of the following medications: -cisapride -dofetilide -dronedarone -linezolid -MAOIs like Carbex, Eldepryl, Marplan, Nardil, and Parnate -methylene blue (injected into a vein) -pimozide -thioridazine This medicine may also interact with the following medications: -alcohol -amphetamines -aspirin and aspirin-like medicines -certain medicines for depression, anxiety, or psychotic disturbances -certain medicines for fungal infections like ketoconazole, fluconazole, posaconazole, and itraconazole -certain medicines for irregular heart beat like flecainide, quinidine, propafenone -certain medicines for migraine headaches like almotriptan, eletriptan, frovatriptan, naratriptan, rizatriptan, sumatriptan, zolmitriptan -certain medicines for sleep -certain medicines for seizures like carbamazepine, valproic acid, phenytoin -certain medicines that treat or prevent blood clots like warfarin, enoxaparin, dalteparin -cimetidine -digoxin -diuretics -fentanyl -isoniazid -lithium -NSAIDs, medicines for pain and inflammation, like ibuprofen or naproxen -other medicines that prolong the QT interval (cause an abnormal heart rhythm) -rasagiline -safinamide -supplements like St. John's wort, kava kava, valerian -tolbutamide -tramadol -tryptophan This list may not describe all possible interactions. Give your health care provider a list of all the medicines, herbs, non-prescription drugs, or dietary supplements you use. Also tell them if you smoke, drink alcohol, or use illegal drugs. Some items  may interact with your medicine. What should I watch for while using this medicine? Tell your doctor if your symptoms do not  get better or if they get worse. Visit your doctor or health care professional for regular checks on your progress. Because it may take several weeks to see the full effects of this medicine, it is important to continue your treatment as prescribed by your doctor. Patients and their families should watch out for new or worsening thoughts of suicide or depression. Also watch out for sudden changes in feelings such as feeling anxious, agitated, panicky, irritable, hostile, aggressive, impulsive, severely restless, overly excited and hyperactive, or not being able to sleep. If this happens, especially at the beginning of treatment or after a change in dose, call your health care professional. Bonita Quin may get drowsy or dizzy. Do not drive, use machinery, or do anything that needs mental alertness until you know how this medicine affects you. Do not stand or sit up quickly, especially if you are an older patient. This reduces the risk of dizzy or fainting spells. Alcohol may interfere with the effect of this medicine. Avoid alcoholic drinks. Your mouth may get dry. Chewing sugarless gum or sucking hard candy, and drinking plenty of water may help. Contact your doctor if the problem does not go away or is severe. What side effects may I notice from receiving this medicine? Side effects that you should report to your doctor or health care professional as soon as possible: -allergic reactions like skin rash, itching or hives, swelling of the face, lips, or tongue -anxious -black, tarry stools -changes in vision -confusion -elevated mood, decreased need for sleep, racing thoughts, impulsive behavior -eye pain -fast, irregular heartbeat -feeling faint or lightheaded, falls -feeling agitated, angry, or irritable -hallucination, loss of contact with reality -loss of balance or coordination -loss of memory -painful or prolonged erections -restlessness, pacing, inability to keep still -seizures -stiff  muscles -suicidal thoughts or other mood changes -trouble sleeping -unusual bleeding or bruising -unusually weak or tired -vomiting Side effects that usually do not require medical attention (report to your doctor or health care professional if they continue or are bothersome): -change in appetite or weight -change in sex drive or performance -diarrhea -increased sweating -indigestion, nausea -tremors This list may not describe all possible side effects. Call your doctor for medical advice about side effects. You may report side effects to FDA at 1-800-FDA-1088. Where should I keep my medicine? Keep out of the reach of children. Store at room temperature between 15 and 30 degrees C (59 and 86 degrees F). Throw away any unused medicine after the expiration date. NOTE: This sheet is a summary. It may not cover all possible information. If you have questions about this medicine, talk to your doctor, pharmacist, or health care provider.  2018 Elsevier/Gold Standard (2016-11-21 14:17:49) Postpartum Depression and Baby Blues The postpartum period begins right after the birth of a baby. During this time, there is often a great amount of joy and excitement. It is also a time of many changes in the life of the parents. Regardless of how many times a mother gives birth, each child brings new challenges and dynamics to the family. It is not unusual to have feelings of excitement along with confusing shifts in moods, emotions, and thoughts. All mothers are at risk of developing postpartum depression or the "baby blues." These mood changes can occur right after giving birth, or they may occur many months after giving birth. The baby  blues or postpartum depression can be mild or severe. Additionally, postpartum depression can go away rather quickly, or it can be a long-term condition. What are the causes? Raised hormone levels and the rapid drop in those levels are thought to be a main cause of postpartum  depression and the baby blues. A number of hormones change during and after pregnancy. Estrogen and progesterone usually decrease right after the delivery of your baby. The levels of thyroid hormone and various cortisol steroids also rapidly drop. Other factors that play a role in these mood changes include major life events and genetics. What increases the risk? If you have any of the following risks for the baby blues or postpartum depression, know what symptoms to watch out for during the postpartum period. Risk factors that may increase the likelihood of getting the baby blues or postpartum depression include:  Having a personal or family history of depression.  Having depression while being pregnant.  Having premenstrual mood issues or mood issues related to oral contraceptives.  Having a lot of life stress.  Having marital conflict.  Lacking a social support network.  Having a baby with special needs.  Having health problems, such as diabetes.  What are the signs or symptoms? Symptoms of baby blues include:  Brief changes in mood, such as going from extreme happiness to sadness.  Decreased concentration.  Difficulty sleeping.  Crying spells, tearfulness.  Irritability.  Anxiety.  Symptoms of postpartum depression typically begin within the first month after giving birth. These symptoms include:  Difficulty sleeping or excessive sleepiness.  Marked weight loss.  Agitation.  Feelings of worthlessness.  Lack of interest in activity or food.  Postpartum psychosis is a very serious condition and can be dangerous. Fortunately, it is rare. Displaying any of the following symptoms is cause for immediate medical attention. Symptoms of postpartum psychosis include:  Hallucinations and delusions.  Bizarre or disorganized behavior.  Confusion or disorientation.  How is this diagnosed? A diagnosis is made by an evaluation of your symptoms. There are no medical or lab  tests that lead to a diagnosis, but there are various questionnaires that a health care provider may use to identify those with the baby blues, postpartum depression, or psychosis. Often, a screening tool called the [place] Postnatal Depression Scale is used to diagnose depression in the postpartum period. How is this treated? The baby blues usually goes away on its own in 1-2 weeks. Social support is often all that is needed. You will be encouraged to get adequate sleep and rest. Occasionally, you may be given medicines to help you sleep. Postpartum depression requires treatment because it can last several months or longer if it is not treated. Treatment may include individual or group therapy, medicine, or both to address any social, physiological, and psychological factors that may play a role in the depression. Regular exercise, a healthy diet, rest, and social support may also be strongly recommended. Postpartum psychosis is more serious and needs treatment right away. Hospitalization is often needed. Follow these instructions at home:  Get as much rest as you can. Nap when the baby sleeps.  Exercise regularly. Some women find yoga and walking to be beneficial.  Eat a balanced and nourishing diet.  Do little things that you enjoy. Have a cup of tea, take a bubble bath, read your favorite magazine, or listen to your favorite music.  Avoid alcohol.  Ask for help with household chores, cooking, grocery shopping, or running errands as needed. Do not  try to do everything.  Talk to people close to you about how you are feeling. Get support from your partner, family members, friends, or other new moms.  Try to stay positive in how you think. Think about the things you are grateful for.  Do not spend a lot of time alone.  Only take over-the-counter or prescription medicine as directed by your health care provider.  Keep all your postpartum appointments.  Let your health care provider know  if you have any concerns. Contact a health care provider if: You are having a reaction to or problems with your medicine. Get help right away if:  You have suicidal feelings.  You think you may harm the baby or someone else. This information is not intended to replace advice given to you by your health care provider. Make sure you discuss any questions you have with your health care provider. Document Released: 08/21/2004 Document Revised: 04/24/2016 Document Reviewed: 08/29/2013 Elsevier Interactive Patient Education  2017 [employer].

## 2017-12-04 NOTE — Progress Notes (Signed)
GYN ENCOUNTER NOTE  Subjective:       [name] is a 27 y.o. G81P1001 female is here for gynecologic evaluation of the following issues:  1. Postpartum depression 2. MRSA infection-no relief since taking Bactrim for seven (7) days.  Reports increased feelings of frustration and "lost of interest" in most things. Recently lost job. Aware of many changes happening at once. History of therapy after divorce of parents in childhood.  Recently she was "confronted" by her husband, pastor, and first lady who have "all noticed a change".  Denies difficulty breathing or respiratory distress, chest pain, abdominal pain, vaginal bleeding, dysuria, and leg pain or swelling. No SI/HI.   Gynecologic History No LMP recorded. Breastfeeding.   Contraception: oral progesterone-only contraceptive   Last Pap: 11/12/2017. Results were: normal  Obstetric History  OB History  Gravida Para Term Preterm AB Living  1 1 1     1   SAB TAB Ectopic Multiple Live Births          1    # Outcome Date GA Lbr Len/2nd Weight Sex Delivery Anes PTL Lv  1 Term 2018   6 lb 2.4 oz (2.79 kg)  Vag-Spont   LIV      No past medical history on file.  Past Surgical History:  Procedure Laterality Date  . WISDOM TOOTH EXTRACTION      Current Outpatient Medications on File Prior to Visit  Medication Sig Dispense Refill  . albuterol (PROVENTIL HFA;VENTOLIN HFA) 108 (90 Base) MCG/ACT inhaler Inhale 2 puffs into the lungs every 6 (six) hours as needed for wheezing or shortness of breath. 1 Inhaler 2  . norethindrone (MICRONOR,CAMILA,ERRIN) 0.35 MG tablet Take 1 tablet (0.35 mg total) by mouth daily. 12 Package 0  . prenatal vitamin w/FE, FA (PRENATAL 1 + 1) 27-1 MG TABS tablet Take 1 tablet by mouth daily at 12 noon.    . nystatin-triamcinolone (MYCOLOG II) cream Apply 1 application topically 2 (two) times daily. (Patient not taking: Reported on 12/03/2017) 30 g 0   No current facility-administered medications on  file prior to visit.     No Known Allergies  Social History   Socioeconomic History  . Marital status: Married    Spouse name: Not on file  . Number of children: Not on file  . Years of education: Not on file  . Highest education level: Not on file  Social Needs  . Financial resource strain: Not on file  . Food insecurity - worry: Not on file  . Food insecurity - inability: Not on file  . Transportation needs - medical: Not on file  . Transportation needs - non-medical: Not on file  Occupational History  . Not on file  Tobacco Use  . Smoking status: Former Smoker    Last attempt to quit: 11/24/2014    Years since quitting: 3.0  . Smokeless tobacco: Never Used  Substance and Sexual Activity  . Alcohol use: No  . Drug use: No  . Sexual activity: Yes    Partners: Male    Birth control/protection: Pill  Other Topics Concern  . Not on file  Social History Narrative  . Not on file    Family History  Problem Relation Age of Onset  . Hypertension Mother   . Cancer Mother        SKIN CANCER  . Hypertension Father   . Cancer Father        PROSTATE CANCER  . Seizures Brother   . Cancer  Maternal Grandmother        BRAIN TUMOR    The following portions of the patient's history were reviewed and updated as appropriate: allergies, current medications, past family history, past medical history, past social history, past surgical history and problem list.  Review of Systems  Review of Systems - Negative except as noted above. History obtained from the patient.  Objective:   BP 126/84   Pulse 86   Ht 5\' 6"  (1.676 m)   Wt 237 lb 1.6 oz (107.5 kg)   BMI 38.27 kg/m    CONSTITUTIONAL: Well-developed, well-nourished female in no acute distress.   HENT:  Normocephalic, atraumatic.   NECK: Normal range of motion, supple, no masses.    SKIN: Skin is warm and dry. Not diaphoretic. No erythema. No pallor. Flaky, dry, red patches with clear drainage to bilateral  ankles-cultures reported MRSA  NEUROLGIC: Alert and oriented to person, place, and time.   PSYCHIATRIC: Normal mood and affect. Normal behavior. Normal judgment and thought content.  MUSCULOSKELETAL: Normal range of motion. No tenderness.  No cyanosis, clubbing, or edema.  Depression screen PHQ 2/9 12/04/2017  Decreased Interest 2  Down, Depressed, Hopeless 1  PHQ - 2 Score 3  Altered sleeping 3  Tired, decreased energy 3  Change in appetite 2  Feeling bad or failure about yourself  1  Trouble concentrating 0  Moving slowly or fidgety/restless 0  Suicidal thoughts 0  PHQ-9 Score 12    Assessment:   1. MRSA (methicillin resistant Staphylococcus aureus) infection  2. Postpartum depression  Plan:   Discussed results of PHQ9. Advised therapy or medication. Pt would like to try both therapy and medication.  Rx: Zoloft and Clindamycin, see orders.   Referred to Kathreen CosierAmanda Marvin, LCSW.   Reviewed red flag symptoms and when to call.   RTC x 6 weeks for medication check.    Gunnar BullaJenkins Michelle Nanie Dunkleberger, CNM [facility], [facility]

## 2017-12-14 ENCOUNTER — Encounter: Payer: Self-pay | Admitting: Certified Nurse Midwife

## 2017-12-14 ENCOUNTER — Other Ambulatory Visit: Payer: Self-pay | Admitting: Certified Nurse Midwife

## 2017-12-14 DIAGNOSIS — Z22322 Carrier or suspected carrier of Methicillin resistant Staphylococcus aureus: Secondary | ICD-10-CM

## 2018-01-02 ENCOUNTER — Encounter: Payer: Self-pay | Admitting: Certified Nurse Midwife

## 2018-01-13 ENCOUNTER — Ambulatory Visit (INDEPENDENT_AMBULATORY_CARE_PROVIDER_SITE_OTHER): Payer: Self-pay | Admitting: Infectious Diseases

## 2018-01-13 ENCOUNTER — Encounter: Payer: Self-pay | Admitting: Infectious Diseases

## 2018-01-13 DIAGNOSIS — L409 Psoriasis, unspecified: Secondary | ICD-10-CM | POA: Insufficient documentation

## 2018-01-13 MED ORDER — CLOBETASOL PROPIONATE 0.05 % EX CREA: 1.0000 "application " | TOPICAL_CREAM | Freq: Two times a day (BID) | CUTANEOUS | 0 refills | Status: DC

## 2018-01-13 NOTE — Progress Notes (Signed)
   Subjective:    Patient ID: [name], female    DOB: 11/11/1991, 27 y.o.   MRN: [id number]  HPI 27 yo F who had baby June 2018. Baby healthy, no problems.  She had areas on her ankle which were swabbed by her MD and she was given bactrim for 10 days with no change. She then took 10 days of clinda with no change.   The past medical history, family history and social history were reviewed/updated in EPIC   Review of Systems  Constitutional: Negative for appetite change, chills, fever and unexpected weight change.  HENT: Negative for mouth sores.   Gastrointestinal: Negative for constipation and diarrhea.  Genitourinary: Negative for difficulty urinating and menstrual problem.  Musculoskeletal: Positive for arthralgias.  Skin: Positive for rash.  Hematological: Positive for adenopathy.  amenorrheic. Just stopped breast feeding.  Is working on wt loss. Up 20-30#.  Has also noted dry skin on fingers, cracking.  Has noted dry scalp as well.  Has tried hydrocortisone, coconut oil.  Please see HPI. All other systems reviewed and negative.     Objective:   Physical Exam  Constitutional: She is oriented to person, place, and time. She appears well-developed and well-nourished.  HENT:  Mouth/Throat: No oropharyngeal exudate.  Eyes: EOM are normal. Pupils are equal, round, and reactive to light.  Neck: Neck supple.  Cardiovascular: Normal rate, regular rhythm and normal heart sounds.  Pulmonary/Chest: Effort normal and breath sounds normal.  Abdominal: Soft. Bowel sounds are normal. There is no tenderness. There is no rebound.  Musculoskeletal: She exhibits no edema.  Lymphadenopathy:    She has no cervical adenopathy.  Neurological: She is alert and oriented to person, place, and time.  Skin: Rash noted.     Psychiatric: She has a normal mood and affect.        Assessment & Plan:

## 2018-01-13 NOTE — Assessment & Plan Note (Addendum)
I explained ot her that this is not consistent with MRSA There are several things that could look like this- psoriasis, tinea, eczema.  Will give her trial of Clobetasol 0.05% oint, 30g. [facility] $11.75  Will see her back in 2-3 weeks.  I offered to have her seen by Derm as well.  Will refer her although her insurance may preclude this.

## 2018-02-09 ENCOUNTER — Encounter: Payer: Self-pay | Admitting: Certified Nurse Midwife

## 2018-02-10 NOTE — Telephone Encounter (Signed)
Please refill Zoloft, Change to Junel 1/20, and given Omnicef 300 mg BID x 7 days. Advise to come in or go to urgent care if symptoms worsen after three (3) days of treatment. Thanks,JML.

## 2018-02-11 ENCOUNTER — Other Ambulatory Visit: Payer: Self-pay

## 2018-02-11 ENCOUNTER — Ambulatory Visit (INDEPENDENT_AMBULATORY_CARE_PROVIDER_SITE_OTHER): Payer: Medicaid Other | Admitting: Certified Nurse Midwife

## 2018-02-11 VITALS — BP 131/84 | HR 90 | Temp 98.0°F | Ht 66.0 in | Wt 244.1 lb

## 2018-02-11 DIAGNOSIS — H5789 Other specified disorders of eye and adnexa: Secondary | ICD-10-CM

## 2018-02-11 MED ORDER — NORETHIN ACE-ETH ESTRAD-FE 1-20 MG-MCG PO TABS: 1.0000 | ORAL_TABLET | Freq: Every day | ORAL | 11 refills | Status: DC

## 2018-02-11 MED ORDER — CEFDINIR 300 MG PO CAPS: 300.0000 mg | ORAL_CAPSULE | Freq: Two times a day (BID) | ORAL | 0 refills | Status: DC

## 2018-02-11 MED ORDER — CIPROFLOXACIN HCL 0.3 % OP SOLN: 2.0000 [drp] | OPHTHALMIC | 0 refills | Status: DC

## 2018-02-11 MED ORDER — SERTRALINE HCL 50 MG PO TABS: 50.0000 mg | ORAL_TABLET | Freq: Every day | ORAL | 1 refills | Status: DC

## 2018-02-11 MED ORDER — ALBUTEROL SULFATE HFA 108 (90 BASE) MCG/ACT IN AERS: 2.0000 | INHALATION_SPRAY | Freq: Four times a day (QID) | RESPIRATORY_TRACT | 2 refills | Status: DC | PRN

## 2018-02-11 MED ORDER — SERTRALINE HCL 50 MG PO TABS: 50.0000 mg | ORAL_TABLET | Freq: Every day | ORAL | 5 refills | Status: DC

## 2018-02-11 NOTE — Patient Instructions (Signed)

## 2018-02-11 NOTE — Progress Notes (Signed)
Pt is here with c/o pink eye. Also needs a refill on OCPs and zoloft. Pt thinks she might have bronchitis.

## 2018-02-17 NOTE — Progress Notes (Signed)
GYN ENCOUNTER NOTE  Subjective:       [name] is a 27 y.o. G45P1001 female here for evaluation of bilateral eye discharge, swelling, and redness; R>L.   Denies difficulty breathing or respiratory distress, chest pain, abdominal pain, excessive vaginal bleeding, dysuria, and leg pain or swelling.   Gynecologic History  No LMP recorded.   Contraception: oral progesterone-only contraceptive  Last Pap: 10/2017. Results were: normal  Obstetric History  OB History  Gravida Para Term Preterm AB Living  1 1 1     1   SAB TAB Ectopic Multiple Live Births          1    # Outcome Date GA Lbr Len/2nd Weight Sex Delivery Anes PTL Lv  1 Term 2018   6 lb 2.4 oz (2.79 kg)  Vag-Spont   LIV      No past medical history on file.  Past Surgical History:  Procedure Laterality Date  . WISDOM TOOTH EXTRACTION      Current Outpatient Medications on File Prior to Visit  Medication Sig Dispense Refill  . norethindrone (MICRONOR,CAMILA,ERRIN) 0.35 MG tablet Take 1 tablet (0.35 mg total) by mouth daily. 12 Package 0  . clobetasol cream (TEMOVATE) 0.05 % Apply 1 application topically 2 (two) times daily. (Patient not taking: Reported on 02/11/2018) 30 g 0  . nystatin-triamcinolone (MYCOLOG II) cream Apply 1 application topically 2 (two) times daily. (Patient not taking: Reported on 12/03/2017) 30 g 0  . prenatal vitamin w/FE, FA (PRENATAL 1 + 1) 27-1 MG TABS tablet Take 1 tablet by mouth daily at 12 noon.     No current facility-administered medications on file prior to visit.     No Known Allergies  Social History   Socioeconomic History  . Marital status: Married    Spouse name: Not on file  . Number of children: Not on file  . Years of education: Not on file  . Highest education level: Not on file  Social Needs  . Financial resource strain: Not on file  . Food insecurity - worry: Not on file  . Food insecurity - inability: Not on file  . Transportation needs - medical: Not  on file  . Transportation needs - non-medical: Not on file  Occupational History  . Not on file  Tobacco Use  . Smoking status: Former Smoker    Last attempt to quit: 11/24/2014    Years since quitting: 3.2  . Smokeless tobacco: Never Used  Substance and Sexual Activity  . Alcohol use: No  . Drug use: No  . Sexual activity: Yes    Partners: Male    Birth control/protection: Pill  Other Topics Concern  . Not on file  Social History Narrative  . Not on file    Family History  Problem Relation Age of Onset  . Hypertension Mother   . Cancer Mother        SKIN CANCER  . Hypertension Father   . Cancer Father        PROSTATE CANCER  . Seizures Brother   . Cancer Maternal Grandmother        BRAIN TUMOR    The following portions of the patient's history were reviewed and updated as appropriate: allergies, current medications, past family history, past medical history, past social history, past surgical history and problem list.  Review of Systems  Review of Systems - Negative except as noted above. History obtained from the patient.  Objective:   BP 131/84  Pulse 90   Temp 98 F (36.7 C)   Ht 5\' 6"  (1.676 m)   Wt 244 lb 1 oz (110.7 kg)   Breastfeeding? No   BMI 39.39 kg/m    CONSTITUTIONAL: Well-developed, well-nourished female in no acute distress.   HENT:  Bilateral red eyes, with yellow crust like drainage caked on eyelashes.   NEUROLGIC: Alert and oriented to person, place, and time.   Depression screen [facility] 2/9 02/11/2018 01/13/2018 12/04/2017  Decreased Interest 0 0 2  Down, Depressed, Hopeless 0 0 1  PHQ - 2 Score 0 0 3  Altered sleeping 1 - 3  Tired, decreased energy 1 - 3  Change in appetite 1 - 2  Feeling bad or failure about yourself  0 - 1  Trouble concentrating 0 - 0  Moving slowly or fidgety/restless 0 - 0  Suicidal thoughts 0 - 0  PHQ-9 Score 3 - 12   Assessment:   1. Eye drainage  2. Eye swelling, bilateral  3. Redness of both  eyes  Plan:   Rx: Junel, Zoloft, Omnicef, Ciloxan; see orders.   Reviewed red flag symptoms and when to call.   RTC as needed.    Gunnar BullaJenkins Michelle Lawhorn, CNM [facility], [facility]

## 2018-03-02 ENCOUNTER — Ambulatory Visit (INDEPENDENT_AMBULATORY_CARE_PROVIDER_SITE_OTHER): Payer: Self-pay | Admitting: Obstetrics and Gynecology

## 2018-03-02 ENCOUNTER — Encounter: Payer: Self-pay | Admitting: Obstetrics and Gynecology

## 2018-03-02 VITALS — BP 142/86 | HR 86 | Ht 66.0 in | Wt 244.1 lb

## 2018-03-02 DIAGNOSIS — E669 Obesity, unspecified: Secondary | ICD-10-CM

## 2018-03-02 DIAGNOSIS — N912 Amenorrhea, unspecified: Secondary | ICD-10-CM

## 2018-03-02 LAB — POCT URINE PREGNANCY: Preg Test, Ur: NEGATIVE

## 2018-03-02 NOTE — Progress Notes (Signed)
Subjective:  [name] is a 27 y.o. G1P1001 at Unknown being seen today for weight loss management- initial visit.  Patient reports General ROS: negative except no menses due to breastfeeding ( she stopped 2 months ago)  Onset followed:   recent pregnancy, and starting zoloft, increased appetite while breastfeeding.     The following portions of the patient's history were reviewed and updated as appropriate: allergies, current medications, past family history, past medical history, past social history, past surgical history and problem list.   Objective:   Vitals:   03/02/18 1023  BP: (!) 142/86  Pulse: 86  Weight: 244 lb 1.6 oz (110.7 kg)  Height: 5\' 6"  (1.676 m)    General:  Alert, oriented and cooperative. Patient is in no acute distress.  :   :   :   :   :   :   PE: Well groomed female in no current distress,   Mental Status: Normal mood and affect. Normal behavior. Normal judgment and thought content.   Current BMI: Body mass index is 39.4 kg/m. UPT-  Assessment and Plan:  Obesity  Medication management  Plan: low carb, High protein diet RX for adipex 37.5 mg daily and B12 1000mcg.ml monthly, to start now with first injection given at today's visit. Reviewed side-effects common to both medications and expected outcomes. Increase daily water intake to at least 8 bottle a day, every day.  Goal is to reduse weight by 10% by end of three months, and will re-evaluate then.  RTC in 4 weeks for Nurse visit to check weight & BP, and get next B12 injections.    Please refer to After Visit Summary for other counseling recommendations.    [name], [name], CNM   [name] [place] [name], CNM      Consider the Low Glycemic Index Diet and 6 smaller meals daily .  This boosts your metabolism and regulates your sugars:   Use the protein bar by Atkins because they have lots of fiber in them  Find the low carb flatbreads, tortillas and pita breads for  sandwiches:  Joseph's makes a pita bread and a flat bread , available at [facility] and [employer]; Toufayah makes a low carb flatbread available at [employer] and HT that is 9 net carbs and 100 cal Mission makes a low carb whole wheat tortilla available at [employer],and most grocery stores with 6 net carbs and 210 cal  [place] yogurt can still have a lot of carbs .  Dannon Light Morris fit has 80 cal and 8 carbs

## 2018-03-02 NOTE — Patient Instructions (Signed)

## 2018-03-03 ENCOUNTER — Other Ambulatory Visit: Payer: Self-pay

## 2018-03-03 ENCOUNTER — Emergency Department
Admission: EM | Admit: 2018-03-03 | Discharge: 2018-03-03 | Disposition: A | Discharge status: Home / Self Care | Payer: Medicaid Other | Attending: Emergency Medicine | Admitting: Emergency Medicine

## 2018-03-03 ENCOUNTER — Emergency Department: Payer: Medicaid Other

## 2018-03-03 ENCOUNTER — Encounter: Payer: Self-pay | Admitting: Emergency Medicine

## 2018-03-03 DIAGNOSIS — S93491A Sprain of other ligament of right ankle, initial encounter: Secondary | ICD-10-CM | POA: Diagnosis not present

## 2018-03-03 DIAGNOSIS — Y929 Unspecified place or not applicable: Secondary | ICD-10-CM | POA: Insufficient documentation

## 2018-03-03 DIAGNOSIS — X509XXA Other and unspecified overexertion or strenuous movements or postures, initial encounter: Secondary | ICD-10-CM | POA: Insufficient documentation

## 2018-03-03 DIAGNOSIS — Y939 Activity, unspecified: Secondary | ICD-10-CM | POA: Diagnosis not present

## 2018-03-03 DIAGNOSIS — Z79899 Other long term (current) drug therapy: Secondary | ICD-10-CM | POA: Diagnosis not present

## 2018-03-03 DIAGNOSIS — Z87891 Personal history of nicotine dependence: Secondary | ICD-10-CM | POA: Diagnosis not present

## 2018-03-03 DIAGNOSIS — Y999 Unspecified external cause status: Secondary | ICD-10-CM | POA: Insufficient documentation

## 2018-03-03 DIAGNOSIS — S99911A Unspecified injury of right ankle, initial encounter: Secondary | ICD-10-CM | POA: Diagnosis present

## 2018-03-03 MED ORDER — IBUPROFEN 800 MG PO TABS: 800.0000 mg | ORAL_TABLET | Freq: Three times a day (TID) | ORAL | 0 refills | Status: DC | PRN

## 2018-03-03 MED ORDER — ACETAMINOPHEN 500 MG PO TABS
1000.0000 mg | ORAL_TABLET | Freq: Once | ORAL | Status: AC
  Administered 2018-03-03: 1000 mg via ORAL
  Filled 2018-03-03: qty 2

## 2018-03-03 NOTE — ED Triage Notes (Signed)
Pt to triage via w/c with no distress noted; reports this evening, jumped and landed on her ankle; c/o pain since; bruising/abrasion noted to right ankle

## 2018-03-03 NOTE — ED Provider Notes (Signed)
[facility] EMERGENCY DEPARTMENT Provider Note   CSN: [id number] Arrival date & time: 03/03/18  2143     History   Chief Complaint Chief Complaint  Patient presents with  . Ankle Pain    HPI [name] is a 27 y.o. female.  Presents to the emergency department for evaluation of right ankle pain.  Patient states around 7:50 PM tonight she rolled her right ankle.  Describes a inversion roll.  Took ibuprofen.  When rolling her ankle she felt a pop along the lateral aspect ankle pain and swelling.  She is unable to bear weight.  She denies any other injury to her body.  No upper leg pain noted.  HPI  History reviewed. No pertinent past medical history.  Patient Active Problem List   Diagnosis Date Noted  . Psoriasis 01/13/2018  . 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 Migraines 11/04/2016  . Seasonal allergies 11/04/2016  . Irregular menses 11/04/2016    Past Surgical History:  Procedure Laterality Date  . WISDOM TOOTH EXTRACTION       OB History    Gravida  1   Para  1   Term  1   Preterm      AB      Living  1     SAB      TAB      Ectopic      Multiple      Live Births  1            Home Medications    Prior to Admission medications   Medication Sig Start Date End Date Taking? Authorizing Provider  albuterol (PROVENTIL HFA;VENTOLIN HFA) 108 (90 Base) MCG/ACT inhaler Inhale 2 puffs into the lungs every 6 (six) hours as needed for wheezing or shortness of breath. 02/11/18   Lawhorn, Vanessa [place], CNM  clobetasol cream (TEMOVATE) 0.05 % Apply 1 application topically 2 (two) times daily. Patient not taking: Reported on 02/11/2018 01/13/18   Ginnie Smart, MD  ibuprofen (ADVIL,MOTRIN) 800 MG tablet Take 1 tablet (800 mg total) by mouth every 8 (eight) hours as needed. 03/03/18   Evon Slack, PA-C  norethindrone (MICRONOR,CAMILA,ERRIN) 0.35 MG tablet Take 1 tablet (0.35 mg total) by mouth daily. Patient not  taking: Reported on 03/02/2018 06/08/17   Gunnar Bulla, CNM  norethindrone-ethinyl estradiol (JUNEL FE 1/20) 1-20 MG-MCG tablet Take 1 tablet by mouth daily. Patient not taking: Reported on 03/02/2018 02/11/18   Gunnar Bulla, CNM  nystatin-triamcinolone (MYCOLOG II) cream Apply 1 application topically 2 (two) times daily. Patient not taking: Reported on 12/03/2017 11/12/17   Gunnar Bulla, CNM  prenatal vitamin w/FE, FA (PRENATAL 1 + 1) 27-1 MG TABS tablet Take 1 tablet by mouth daily at 12 noon.    [provider]  sertraline (ZOLOFT) 50 MG tablet Take 1 tablet (50 mg total) by mouth daily. 02/11/18   Lawhorn, Vanessa [place], CNM    Family History Family History  Problem Relation Age of Onset  . Hypertension Mother   . Cancer Mother        SKIN CANCER  . Hypertension Father   . Cancer Father        PROSTATE CANCER  . Seizures Brother   . Cancer Maternal Grandmother        BRAIN TUMOR    Social History Social History   Tobacco Use  . Smoking status: Former Smoker    Last attempt to quit: 11/24/2014  Years since quitting: 3.2  . Smokeless tobacco: Never Used  Substance Use Topics  . Alcohol use: No  . Drug use: No     Allergies   Patient has no known allergies.   Review of Systems Review of Systems  Constitutional: Negative for fever.  Respiratory: Negative for shortness of breath.   Cardiovascular: Negative for chest pain.  Gastrointestinal: Negative for abdominal pain.  Genitourinary: Negative for difficulty urinating, dysuria and urgency.  Musculoskeletal: Positive for arthralgias, gait problem and joint swelling. Negative for back pain and myalgias.  Skin: Negative for rash.  Neurological: Negative for dizziness and headaches.     Physical Exam Updated Vital Signs BP (!) 128/97 (BP Location: Left Arm)   Pulse 79   Temp 97.9 F (36.6 C) (Oral)   Resp 18   Ht 5\' 6"  (1.676 m)   Wt 110.7 kg (244 lb)   SpO2 100%    BMI 39.38 kg/m   Physical Exam  Constitutional: She is oriented to person, place, and time. She appears well-developed and well-nourished.  HENT:  Head: Normocephalic and atraumatic.  Eyes: Conjunctivae are normal.  Neck: Normal range of motion.  Cardiovascular: Normal rate.  Pulmonary/Chest: Effort normal. No respiratory distress.  Musculoskeletal: Normal range of motion.  Examination of the right ankle shows lateral soft tissue swelling.  She is nontender throughout the knee or tib-fib region approximately.  She does have tenderness on the distal fibula.  No medial malleolar tenderness.  Sensation is intact distally throughout the right ankle.  Neurological: She is alert and oriented to person, place, and time.  Skin: Skin is warm. No rash noted.  Psychiatric: She has a normal mood and affect. Her behavior is normal. Thought content normal.     ED Treatments / Results  Labs (all labs ordered are listed, but only abnormal results are displayed) Labs Reviewed - No data to display  EKG None  Radiology Dg Ankle 2 Views Right  Result Date: 03/03/2018 CLINICAL DATA:  Pain, bruising, and abrasion to the right ankle after injury. EXAM: RIGHT ANKLE - 2 VIEW COMPARISON:  None. FINDINGS: Diffuse soft tissue swelling about the right ankle, greatest laterally. Suggestion of a tiny ununited ossicle inferior to the lateral malleolus, likely representing avulsion fragment. There may also be a tiny cortical avulsion fragment at the lateral aspect of the calcaneus distally. Joint spaces are preserved. No expansile or destructive bone lesions. IMPRESSION: Probable avulsion injury to the lateral malleolus and distal lateral calcaneus. Diffuse soft tissue swelling. Electronically Signed   By: Burman NievesWilliam  Stevens M.D.   On: 03/03/2018 22:06    Procedures Procedures (including critical care time)  Medications Ordered in ED Medications  acetaminophen (TYLENOL) tablet 1,000 mg (1,000 mg Oral Given 03/03/18  2227)     Initial Impression / Assessment and Plan / ED Course  I have reviewed the triage vital signs and the nursing notes.  Pertinent labs & imaging results that were available during my care of the patient were reviewed by me and considered in my medical decision making (see chart for details).     26-year female with right lateral ankle sprain.  She is given crutches and ankle stirrup splint today in the emergency department.  Patient will rest ice and elevate the ankle.  She will use crutches as needed for ambulation.  She will alternate Tylenol and ibuprofen as needed for pain.  She is educated on signs symptoms to return to the ED for.  Final Clinical Impressions(s) / ED  Diagnoses   Final diagnoses:  Sprain of anterior talofibular ligament of right ankle, initial encounter    ED Discharge Orders        Ordered    ibuprofen (ADVIL,MOTRIN) 800 MG tablet  Every 8 hours PRN     03/03/18 2224       Ronnette Juniper 03/03/18 2251    Minna Antis, MD 03/03/18 2311

## 2018-03-03 NOTE — Discharge Instructions (Addendum)
Please rest ice and elevate the right ankle.  You may use crutches as needed for ambulation.  Once you are able to ambulate without a limp, you may discontinue crutches.  Take ibuprofen as needed for pain.  Wear stirrup splint as needed.  Would recommend ASO brace over-the-counter to wear with tennis shoes for the next 4-6 weeks.

## 2018-03-15 ENCOUNTER — Other Ambulatory Visit: Payer: Self-pay | Admitting: *Deleted

## 2018-03-15 ENCOUNTER — Encounter: Payer: Self-pay | Admitting: Obstetrics and Gynecology

## 2018-03-15 MED ORDER — CYANOCOBALAMIN 1000 MCG/ML IJ SOLN: 1000.0000 ug | Freq: Once | INTRAMUSCULAR | 3 refills | Status: AC

## 2018-03-16 ENCOUNTER — Other Ambulatory Visit: Payer: Self-pay | Admitting: *Deleted

## 2018-03-16 MED ORDER — CYANOCOBALAMIN 1000 MCG/ML IJ SOLN: 1000.0000 ug | INTRAMUSCULAR | 1 refills | Status: DC

## 2018-04-05 ENCOUNTER — Other Ambulatory Visit: Payer: Self-pay | Admitting: Certified Nurse Midwife

## 2018-04-05 ENCOUNTER — Ambulatory Visit (INDEPENDENT_AMBULATORY_CARE_PROVIDER_SITE_OTHER): Payer: Medicaid Other | Admitting: Obstetrics and Gynecology

## 2018-04-05 VITALS — BP 136/87 | HR 93 | Wt 239.0 lb

## 2018-04-05 DIAGNOSIS — E669 Obesity, unspecified: Secondary | ICD-10-CM

## 2018-04-05 MED ORDER — CYANOCOBALAMIN 1000 MCG/ML IJ SOLN
1000.0000 ug | Freq: Once | INTRAMUSCULAR | Status: AC
  Administered 2018-04-05: 1000 ug via INTRAMUSCULAR

## 2018-04-05 NOTE — Progress Notes (Signed)
Pt is here for wt, bp check, b-12 inj She is doing well, sprained her ankle hasnt been able to exercise  04/05/18 wt- 239.0lb 03/02/18 wt- 244lblb

## 2018-04-06 ENCOUNTER — Telehealth: Payer: Self-pay | Admitting: Certified Nurse Midwife

## 2018-04-06 ENCOUNTER — Other Ambulatory Visit: Payer: Self-pay

## 2018-04-06 MED ORDER — NORETHIN ACE-ETH ESTRAD-FE 1-20 MG-MCG PO TABS: 1.0000 | ORAL_TABLET | Freq: Every day | ORAL | 11 refills | Status: DC

## 2018-04-06 NOTE — Telephone Encounter (Signed)
The patient and her pharmacy called and stated that she needs her birth control sent into her pharmacy and that she also needs that prescription to have refills as well. The patient would like confirmation that it has been sent in, And the pharmacy would like a call as well. Please advise.

## 2018-04-06 NOTE — Telephone Encounter (Signed)
Script sent to [facility]. Mychart message sent to pt.

## 2018-04-17 ENCOUNTER — Encounter: Payer: Self-pay | Admitting: Certified Nurse Midwife

## 2018-04-20 ENCOUNTER — Other Ambulatory Visit: Payer: Self-pay | Admitting: Certified Nurse Midwife

## 2018-04-20 ENCOUNTER — Emergency Department
Admission: EM | Admit: 2018-04-20 | Discharge: 2018-04-20 | Disposition: A | Discharge status: Home / Self Care | Payer: No Typology Code available for payment source | Attending: Student in an Organized Health Care Education/Training Program | Admitting: Student in an Organized Health Care Education/Training Program

## 2018-04-20 ENCOUNTER — Emergency Department: Payer: No Typology Code available for payment source

## 2018-04-20 ENCOUNTER — Telehealth: Payer: Self-pay | Admitting: Certified Nurse Midwife

## 2018-04-20 ENCOUNTER — Other Ambulatory Visit: Payer: Self-pay

## 2018-04-20 DIAGNOSIS — Y999 Unspecified external cause status: Secondary | ICD-10-CM | POA: Insufficient documentation

## 2018-04-20 DIAGNOSIS — Z79899 Other long term (current) drug therapy: Secondary | ICD-10-CM | POA: Insufficient documentation

## 2018-04-20 DIAGNOSIS — S20219A Contusion of unspecified front wall of thorax, initial encounter: Secondary | ICD-10-CM | POA: Diagnosis not present

## 2018-04-20 DIAGNOSIS — Y939 Activity, unspecified: Secondary | ICD-10-CM | POA: Diagnosis not present

## 2018-04-20 DIAGNOSIS — Z87891 Personal history of nicotine dependence: Secondary | ICD-10-CM | POA: Diagnosis not present

## 2018-04-20 DIAGNOSIS — S99911A Unspecified injury of right ankle, initial encounter: Secondary | ICD-10-CM | POA: Diagnosis present

## 2018-04-20 DIAGNOSIS — Y92410 Unspecified street and highway as the place of occurrence of the external cause: Secondary | ICD-10-CM | POA: Diagnosis not present

## 2018-04-20 MED ORDER — BACITRACIN ZINC 500 UNIT/GM EX OINT
TOPICAL_OINTMENT | CUTANEOUS | Status: AC
  Administered 2018-04-20: 18:00:00
  Filled 2018-04-20: qty 1.8

## 2018-04-20 MED ORDER — MELOXICAM 15 MG PO TABS: 15.0000 mg | ORAL_TABLET | Freq: Every day | ORAL | 0 refills | Status: DC

## 2018-04-20 MED ORDER — NORETHINDRONE ACET-ETHINYL EST 1.5-30 MG-MCG PO TABS: 1.0000 | ORAL_TABLET | Freq: Every day | ORAL | 11 refills | Status: DC

## 2018-04-20 MED ORDER — OXYCODONE-ACETAMINOPHEN 5-325 MG PO TABS
1.0000 | ORAL_TABLET | Freq: Once | ORAL | Status: AC
  Administered 2018-04-20: 1 via ORAL
  Filled 2018-04-20: qty 1

## 2018-04-20 MED ORDER — ONDANSETRON 8 MG PO TBDP
8.0000 mg | ORAL_TABLET | Freq: Once | ORAL | Status: AC
  Administered 2018-04-20: 8 mg via ORAL
  Filled 2018-04-20: qty 1
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
## 2018-04-20 NOTE — Telephone Encounter (Signed)
Te patient called and stated that she would like for Marcelino Duster to send a stronger B.C into her pharmacy if possible. The patient stated that she sent a My-Chart Message and did not get a response. Please advise.
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
## 2018-04-20 NOTE — ED Notes (Signed)
See triage note.

## 2018-04-21 ENCOUNTER — Encounter: Payer: Self-pay | Admitting: Certified Nurse Midwife

## 2018-04-21 ENCOUNTER — Encounter: Payer: Self-pay | Admitting: Infectious Diseases

## 2018-04-23 DIAGNOSIS — S93401A Sprain of unspecified ligament of right ankle, initial encounter: Secondary | ICD-10-CM | POA: Diagnosis not present

## 2018-05-18 ENCOUNTER — Encounter: Payer: Self-pay | Admitting: Obstetrics and Gynecology

## 2018-05-26 DIAGNOSIS — S93401D Sprain of unspecified ligament of right ankle, subsequent encounter: Secondary | ICD-10-CM | POA: Diagnosis not present

## 2018-06-15 DIAGNOSIS — M25571 Pain in right ankle and joints of right foot: Secondary | ICD-10-CM | POA: Diagnosis not present

## 2018-06-15 DIAGNOSIS — M25671 Stiffness of right ankle, not elsewhere classified: Secondary | ICD-10-CM | POA: Diagnosis not present

## 2018-06-15 DIAGNOSIS — R600 Localized edema: Secondary | ICD-10-CM | POA: Diagnosis not present

## 2018-06-24 DIAGNOSIS — M25571 Pain in right ankle and joints of right foot: Secondary | ICD-10-CM | POA: Diagnosis not present

## 2018-06-24 DIAGNOSIS — R600 Localized edema: Secondary | ICD-10-CM | POA: Diagnosis not present

## 2018-06-24 DIAGNOSIS — M25671 Stiffness of right ankle, not elsewhere classified: Secondary | ICD-10-CM | POA: Diagnosis not present

## 2018-07-01 DIAGNOSIS — M25671 Stiffness of right ankle, not elsewhere classified: Secondary | ICD-10-CM | POA: Diagnosis not present

## 2018-07-01 DIAGNOSIS — M25571 Pain in right ankle and joints of right foot: Secondary | ICD-10-CM | POA: Diagnosis not present

## 2018-07-01 DIAGNOSIS — R600 Localized edema: Secondary | ICD-10-CM | POA: Diagnosis not present

## 2018-07-21 DIAGNOSIS — M25671 Stiffness of right ankle, not elsewhere classified: Secondary | ICD-10-CM | POA: Diagnosis not present

## 2018-07-21 DIAGNOSIS — R600 Localized edema: Secondary | ICD-10-CM | POA: Diagnosis not present

## 2018-07-21 DIAGNOSIS — M25571 Pain in right ankle and joints of right foot: Secondary | ICD-10-CM | POA: Diagnosis not present

## 2018-08-16 DIAGNOSIS — S93401A Sprain of unspecified ligament of right ankle, initial encounter: Secondary | ICD-10-CM | POA: Diagnosis not present

## 2018-08-23 DIAGNOSIS — H5213 Myopia, bilateral: Secondary | ICD-10-CM | POA: Diagnosis not present

## 2018-09-08 ENCOUNTER — Ambulatory Visit: Payer: Medicaid Other | Admitting: Physician Assistant

## 2018-09-08 ENCOUNTER — Encounter: Payer: Self-pay | Admitting: Physician Assistant

## 2018-09-08 VITALS — BP 140/92 | HR 80 | Temp 98.2°F | Resp 16 | Ht 65.0 in | Wt 232.0 lb

## 2018-09-08 DIAGNOSIS — M25512 Pain in left shoulder: Secondary | ICD-10-CM

## 2018-09-08 DIAGNOSIS — R030 Elevated blood-pressure reading, without diagnosis of hypertension: Secondary | ICD-10-CM

## 2018-09-08 DIAGNOSIS — Z130 Encounter for screening for diseases of the blood and blood-forming organs and certain disorders involving the immune mechanism: Secondary | ICD-10-CM

## 2018-09-08 DIAGNOSIS — Z131 Encounter for screening for diabetes mellitus: Secondary | ICD-10-CM

## 2018-09-08 DIAGNOSIS — L409 Psoriasis, unspecified: Secondary | ICD-10-CM

## 2018-09-08 DIAGNOSIS — E669 Obesity, unspecified: Secondary | ICD-10-CM | POA: Diagnosis not present

## 2018-09-08 DIAGNOSIS — Z1329 Encounter for screening for other suspected endocrine disorder: Secondary | ICD-10-CM | POA: Diagnosis not present

## 2018-09-08 DIAGNOSIS — Z13220 Encounter for screening for lipoid disorders: Secondary | ICD-10-CM

## 2018-09-08 MED ORDER — TRIAMCINOLONE ACETONIDE 0.1 % EX CREA: 1.0000 "application " | TOPICAL_CREAM | Freq: Two times a day (BID) | CUTANEOUS | 0 refills | Status: DC

## 2018-09-08 NOTE — Patient Instructions (Signed)
Psoriasis  Psoriasis is a long-term (chronic) skin condition. It causes raised, red patches (plaques) on your skin that look silvery. The red patches may show up anywhere on your body. They can be any size or shape.  Psoriasis can come and go. It can range from mild to very bad. It cannot be passed from one person to another (not contagious). There is no cure for this condition, but it can be helped with treatment.  Follow these instructions at home:  Skin Care   Apply moisturizers to your skin as needed. Only use those that your doctor has said are okay.   Apply cool compresses to the affected areas.   Do not scratch your skin.  Lifestyle     Do not use tobacco products. This includes cigarettes, chewing tobacco, and e-cigarettes. If you need help quitting, ask your doctor.   Drink little or no alcohol.   Try to lower your stress. Meditation or yoga may help.   Get sun as told by your doctor. Do not get sunburned.   Think about joining a psoriasis support group.  Medicines   Take or use over-the-counter and prescription medicines only as told by your doctor.   If you were prescribed an antibiotic, take or use it as told by your doctor. Do not stop taking the antibiotic even if your condition starts to get better.  General instructions   Keep a journal. Use this to help track what triggers an outbreak. Try to avoid any triggers.   See a counselor or social worker if you feel very sad, upset, or hopeless about your condition and these feelings affect your work or relationships.   Keep all follow-up visits as told by your doctor. This is important.  Contact a doctor if:   Your pain gets worse.   You have more redness or warmth in the affected areas.   You have new pain or stiffness in your joints.   Your pain or stiffness in your joints gets worse.   Your nails start to break easily.   Your nails pull away from the nail bed easily.   You have a fever.   You feel very sad (depressed).  This  information is not intended to replace advice given to you by your health care provider. Make sure you discuss any questions you have with your health care provider.  Document Released: 12/25/2004 Document Revised: 04/24/2016 Document Reviewed: 04/04/2015  Elsevier Interactive Patient Education  2018 Elsevier Inc[employer]

## 2018-09-08 NOTE — Progress Notes (Signed)
Patient: [name], Female    DOB: 1991-08-26, 27 y.o.   MRN: [id number] Visit Date: 09/10/2018  Today's Provider: Trey Sailors, PA-C   Chief Complaint  Patient presents with  . New Patient (Initial Visit)   Subjective:    Annual physical exam [name] is a 27 y.o. female who presents today for health maintenance and complete physical. She feels well. She reports exercising none. She reports she is sleeping well.  Living in [place] , [place] - moved here four years ago. Moved from [place], [place]. 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 One daughter aged 18 months. Declines flu shot.  She reports she has a strange sensation in her left collar bone area when she picks up her daughter. She reports she feels a "popping" sound and has to put her bones back in place. She hasn't had any falls or injuries.   She also reports a rash on the back of her ankles and also on her hands. She reports this was cultured by a nurse midwife at her OBGYN office and came back positive for MRSA. She was treated with 10 days of clindamycin without resolution. She was seen by infectious disease and not thought to have MRSA but rather psoriasis. She was prescribed steroid cream which she did not pick up due to cost. Rash has resolved on her ankles but not her hands. Reports itching.   She also reports significant weight gain since the birth of her child. Previously on B12 and Adpiex with gynecology but has since discontinued this. Has had recent ankle injuries which prevent her from exercising. She reports her diet is poor. She will drink two 16 oz cokes per day. 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 She wonders if it might be her thyroid that is causing weight gain.   PAP 11/12/2017 normal. Currently on OCP from gynecology. Tdap UTD.   BP Readings from Last 3 Encounters:  09/08/18 (!) 140/92  04/20/18 135/80  04/05/18 136/87     -----------------------------------------------------------------   Review of Systems  Constitutional: Positive for fatigue and fever.  HENT: Positive for dental problem.     Social History      She  reports that she quit smoking about 3 years ago. She has never used smokeless tobacco. She reports that she does not drink alcohol or use drugs.       Social History   Socioeconomic History  . Marital status: Married    Spouse name: Not on file  . Number of children: Not on file  . Years of education: Not on file  . Highest education level: Not on file  Occupational History  . Not on file  Social Needs  . Financial resource strain: Not on file  . Food insecurity:    Worry: Not on file    Inability: Not on file  . Transportation needs:    Medical: Not on file    Non-medical: Not on file  Tobacco Use  . Smoking status: Former Smoker    Last attempt to quit: 11/24/2014    Years since quitting: 3.7  . Smokeless tobacco: Never Used  Substance and Sexual Activity  . Alcohol use: No  . Drug use: No  . Sexual activity: Yes    Partners: Male    Birth control/protection: Pill  Lifestyle  . Physical activity:    Days per week: Not on file    Minutes per  session: Not on file  . Stress: Not on file  Relationships  . Social connections:    Talks on phone: Not on file    Gets together: Not on file    Attends religious service: Not on file    Active member of club or organization: Not on file    Attends meetings of clubs or organizations: Not on file    Relationship status: Not on file  Other Topics Concern  . Not on file  Social History Narrative  . Not on file    No past medical history on file.   Patient Active Problem List   Diagnosis Date Noted  . Psoriasis 01/13/2018  . 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 Migraines 11/04/2016  . Seasonal allergies 11/04/2016  . Irregular menses 11/04/2016    Past Surgical History:  Procedure Laterality Date  . WISDOM  TOOTH EXTRACTION      Family History        Family Status  Relation Name Status  . Mother  Alive  . Father  Alive  . Brother  Deceased  . MGM  (Not Specified)  . Brother  Deceased        Her family history includes Cancer in her father, maternal grandmother, and mother; Hypertension in her father; Seizures in her brother.      No Known Allergies   Current Outpatient Medications:  .  Norethindrone Acetate-Ethinyl Estradiol (JUNEL,LOESTRIN,MICROGESTIN) 1.5-30 MG-MCG tablet, Take 1 tablet by mouth daily., Disp: 1 Package, Rfl: 11 .  triamcinolone cream (KENALOG) 0.1 %, Apply 1 application topically 2 (two) times daily., Disp: 30 g, Rfl: 0   Patient Care Team: Maryella Shivers as PCP - General (Physician Assistant)      Objective:   Vitals: BP (!) 140/92 (BP Location: Left Arm, Patient Position: Sitting, Cuff Size: Large)   Pulse 80   Temp 98.2 F (36.8 C) (Oral)   Resp 16   Ht 5\' 5"  (1.651 m)   Wt 232 lb (105.2 kg)   LMP 09/02/2018   SpO2 98%   BMI 38.61 kg/m    Vitals:   09/08/18 1419  BP: (!) 140/92  Pulse: 80  Resp: 16  Temp: 98.2 F (36.8 C)  TempSrc: Oral  SpO2: 98%  Weight: 232 lb (105.2 kg)  Height: 5\' 5"  (1.651 m)     Physical Exam  Constitutional: She is oriented to person, place, and time. She appears well-developed and well-nourished.  Cardiovascular: Normal rate and regular rhythm.  Pulmonary/Chest: Effort normal and breath sounds normal.  Musculoskeletal:  Left clavicle nontender to palpation. No palpable separation of left AC joint. Normal ROM in left shoulder.   Neurological: She is alert and oriented to person, place, and time.  Skin: Skin is warm and dry.  Hypopigmented patches on posterior ankle where rash was previously. Flaky patches on hands and fingers bilaterally.   Psychiatric: She has a normal mood and affect. Her behavior is normal.     Depression Screen PHQ 2/9 Scores 09/08/2018 02/11/2018 01/13/2018 12/04/2017  PHQ - 2  Score 0 0 0 3  PHQ- 9 Score 2 3 - 12  Exception Documentation - - - Medical reason      Assessment & Plan:     Routine Health Maintenance and Physical Exam  Exercise Activities and Dietary recommendations Goals   None     Immunization History  Administered Date(s) Administered  . Tdap 02/06/2017    Health Maintenance  Topic Date Due  .  INFLUENZA VACCINE  07/01/2018  . PAP SMEAR  11/12/2020  . TETANUS/TDAP  02/07/2027  . HIV Screening  Completed     Discussed health benefits of physical activity, and encouraged her to engage in regular exercise appropriate for her age and condition.   1. Acute pain of left shoulder  Uncertain what sensation she is feeling, very low suspicion of AC joint separation as well as shoulder dislocation. Offered xray, declines at this point. Will likely need to see orthopedics.  2. Psoriasis  Reviewed pictures from infectious disease, rash seems consistent with psoriasis.   - triamcinolone cream (KENALOG) 0.1 %; Apply 1 application topically 2 (two) times daily.  Dispense: 30 g; Refill: 0  3. Thyroid disorder screening  - TSH  4. Diabetes mellitus screening  - Comprehensive Metabolic Panel (CMET)  5. Screening for deficiency anemia  - CBC with Differential  6. Screening cholesterol level  - Lipid Profile  7. Obesity (BMI 35.0-39.9 without comorbidity)  Counseled on keeping food log, eliminating soda intake and trying to make meals at home rather than eating fast food.   8. Elevated BP without diagnosis of hypertension  BP elevated today, has been borderline on past readings. Observe, may need treatment in the future if not fixed by weight loss.   Return in about 1 year (around 09/09/2019).  The entirety of the information documented in the History of Present Illness, Review of Systems and Physical Exam were personally obtained by me. Portions of this information were initially documented by Rondel Baton, CMA and reviewed  by me for thoroughness and accuracy.    --------------------------------------------------------------------    Trey Sailors, PA-C  [facility]

## 2018-10-04 DIAGNOSIS — H52223 Regular astigmatism, bilateral: Secondary | ICD-10-CM | POA: Diagnosis not present

## 2018-11-10 ENCOUNTER — Ambulatory Visit: Payer: Medicaid Other | Admitting: Physician Assistant

## 2018-12-01 NOTE — L&D Delivery Note (Signed)
Delivery Note At 0559 am a viable and healthy female "Lanelle Bal" was delivered via  (Presentation:LOA ;  ).  APGAR: 8,9 ; weight pending skin to skin  .   Placenta status: delivered intact with increased calcifications and 3 vessel  Cord:  with the following complications: [place] x2 infant delivered through   Anesthesia:  epidural Episiotomy:  none Lacerations:  none Suture Repair: NA Est. Blood Loss (mL):  200  Mom to postpartum.  Baby to Couplet care / Skin to Skin.  Keimari  N Matyas Baisley 09/03/2019, 6:11 AM

## 2018-12-08 ENCOUNTER — Encounter: Payer: Self-pay | Admitting: Emergency Medicine

## 2018-12-08 ENCOUNTER — Emergency Department
Admission: EM | Admit: 2018-12-08 | Discharge: 2018-12-08 | Disposition: A | Discharge status: Home / Self Care | Payer: Medicaid Other | Attending: Emergency Medicine | Admitting: Emergency Medicine

## 2018-12-08 DIAGNOSIS — H10213 Acute toxic conjunctivitis, bilateral: Secondary | ICD-10-CM | POA: Insufficient documentation

## 2018-12-08 DIAGNOSIS — H5789 Other specified disorders of eye and adnexa: Secondary | ICD-10-CM | POA: Diagnosis present

## 2018-12-08 DIAGNOSIS — Z87891 Personal history of nicotine dependence: Secondary | ICD-10-CM | POA: Insufficient documentation

## 2018-12-08 DIAGNOSIS — T65891A Toxic effect of other specified substances, accidental (unintentional), initial encounter: Secondary | ICD-10-CM | POA: Diagnosis not present

## 2018-12-08 DIAGNOSIS — J029 Acute pharyngitis, unspecified: Secondary | ICD-10-CM | POA: Diagnosis not present

## 2018-12-08 DIAGNOSIS — Z79899 Other long term (current) drug therapy: Secondary | ICD-10-CM | POA: Insufficient documentation

## 2018-12-08 LAB — INFLUENZA PANEL BY PCR (TYPE A & B)
Influenza A By PCR: NEGATIVE
Influenza B By PCR: NEGATIVE

## 2018-12-08 LAB — GROUP A STREP BY PCR: GROUP A STREP BY PCR: NOT DETECTED

## 2018-12-08 MED ORDER — AMOXICILLIN 500 MG PO CAPS: 500.0000 mg | ORAL_CAPSULE | Freq: Three times a day (TID) | ORAL | 0 refills | Status: DC

## 2018-12-08 MED ORDER — TOBRAMYCIN-DEXAMETHASONE 0.3-0.1 % OP SUSP: 2.0000 [drp] | OPHTHALMIC | 0 refills | Status: DC

## 2018-12-08 NOTE — Discharge Instructions (Addendum)
Follow-up with your regular doctor if not better in 2 to 3 days.  Return emergency department worsening.  Use the medications as prescribed.
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
## 2018-12-08 NOTE — ED Provider Notes (Signed)
[facility] Emergency Department Provider Note  ____________________________________________   First MD Initiated Contact with Patient 12/08/18 1046     (approximate)  I have reviewed the triage vital signs and the nursing notes.   HISTORY  Chief Complaint Eye Drainage and Eye Pain    HPI Carlis AbbottRebecca Chittick is a 28 y.o. female presents emergency department complaining of bilateral eye redness with crusting.  She states she was splashed in the eyes with a WaterPik last night.  [facility] had a mixture of Listerine water in her sister saliva.  She states that she woke this morning with eyes crusted over.  She states she is also had some low-grade temp, fever and body aches with sore throat.  She is unsure if she has strep throat.  She states she has had a mild cough.  No chest pain or shortness of breath.    History reviewed. No pertinent past medical history.  Patient Active Problem List   Diagnosis Date Noted  . Psoriasis 01/13/2018  . 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 Migraines 11/04/2016  . Seasonal allergies 11/04/2016  . Irregular menses 11/04/2016    Past Surgical History:  Procedure Laterality Date  . WISDOM TOOTH EXTRACTION      Prior to Admission medications   Medication Sig Start Date End Date Taking? Authorizing Provider  amoxicillin (AMOXIL) 500 MG capsule Take 1 capsule (500 mg total) by mouth 3 (three) times daily. 12/08/18   Sherrie MustacheFisher, Roselyn BeringSusan W, PA-C  Norethindrone Acetate-Ethinyl Estradiol (JUNEL,LOESTRIN,MICROGESTIN) 1.5-30 MG-MCG tablet Take 1 tablet by mouth daily. 04/20/18   Lawhorn, Vanessa DurhamJenkins Michelle, CNM  tobramycin-dexamethasone [facility](TOBRADEX) ophthalmic solution Place 2 drops into both eyes every 4 (four) hours while awake. For 5 days 12/08/18   Faythe GheeFisher, Charlis Harner W, PA-C  triamcinolone cream (KENALOG) 0.1 % Apply 1 application topically 2 (two) times daily. 09/08/18   Trey SailorsPollak, Adriana M, PA-C    Allergies Patient has no known  allergies.  Family History  Problem Relation Age of Onset  . Cancer Mother        SKIN CANCER  . Hypertension Father   . Cancer Father        PROSTATE CANCER  . Seizures Brother   . Cancer Maternal Grandmother        BRAIN TUMOR    Social History Social History   Tobacco Use  . Smoking status: Former Smoker    Last attempt to quit: 11/24/2014    Years since quitting: 4.0  . Smokeless tobacco: Never Used  Substance Use Topics  . Alcohol use: No  . Drug use: No    Review of Systems  Constitutional: Mild fever/chills Eyes: No visual changes.  Positive for red eyes with crusting ENT: Positive sore throat. Respiratory: Denies cough Genitourinary: Negative for dysuria. Musculoskeletal: Negative for back pain. Skin: Negative for rash.    ____________________________________________   PHYSICAL EXAM:  VITAL SIGNS: ED Triage Vitals  Enc Vitals Group     BP 12/08/18 1015 (!) 159/122     Pulse Rate 12/08/18 1015 98     Resp 12/08/18 1015 20     Temp 12/08/18 1015 97.8 F (36.6 C)     Temp Source 12/08/18 1015 Oral     SpO2 12/08/18 1015 100 %     Weight 12/08/18 1016 230 lb (104.3 kg)     Height 12/08/18 1016 5\' 6"  (1.676 m)     Head Circumference --      Peak Flow --  Pain Score 12/08/18 1015 7     Pain Loc --      Pain Edu? --      Excl. in GC? --     Constitutional: Alert and oriented. Well appearing and in no acute distress. Eyes: Conjunctivae are injected bilaterally.  No drainage or crusting is noted at this time. Head: Atraumatic. Nose: No congestion/rhinnorhea. Mouth/Throat: Mucous membranes are moist.  Throat is mildly red Neck:  supple no lymphadenopathy noted Cardiovascular: Normal rate, regular rhythm. Heart sounds are normal Respiratory: Normal respiratory effort.  No retractions, lungs c t a  GU: deferred Musculoskeletal: FROM all extremities, warm and well perfused Neurologic:  Normal speech and language.  Skin:  Skin is warm, dry and  intact. No rash noted. Psychiatric: Mood and affect are normal. Speech and behavior are normal.  ____________________________________________   LABS (all labs ordered are listed, but only abnormal results are displayed)  Labs Reviewed  GROUP A STREP BY PCR  INFLUENZA PANEL BY PCR (TYPE A & B)   ____________________________________________   ____________________________________________  RADIOLOGY    ____________________________________________   PROCEDURES  Procedure(s) performed: No  Procedures    ____________________________________________   INITIAL IMPRESSION / ASSESSMENT AND PLAN / ED COURSE  Pertinent labs & imaging results that were available during my care of the patient were reviewed by me and considered in my medical decision making (see chart for details).   Patient is a 28 year old female presents emergency department complaining of flulike symptoms along with conjunctivitis.  Physical exam shows a nontoxic afebrile female.  Both eyes have injected conjunctivo-without drainage.  Throat is mildly red.   Flu swab and strep swab are negative  Explained the findings to the patient.  Due to the possibility of there being bacteria along with a chemical conjunctivitis she was placed on TobraDex ophthalmic drops, amoxicillin for the pharyngitis.  She is to follow-up with her regular doctor if not better in 2 to 3 days.  Return if worsening.  She states she understands and will comply.  She is discharged in stable condition.  As part of my medical decision making, I reviewed the following data within the electronic MEDICAL RECORD NUMBER Nursing notes reviewed and incorporated, Labs reviewed strep and flu were negative, Old chart reviewed, Notes from prior ED visits and [place] Controlled Substance Database  ____________________________________________   FINAL CLINICAL IMPRESSION(S) / ED DIAGNOSES  Final diagnoses:  Acute chemical conjunctivitis of both eyes  Acute  pharyngitis, unspecified etiology      NEW MEDICATIONS STARTED DURING THIS VISIT:  New Prescriptions   AMOXICILLIN (AMOXIL) 500 MG CAPSULE    Take 1 capsule (500 mg total) by mouth 3 (three) times daily.   TOBRAMYCIN-DEXAMETHASONE (TOBRADEX) OPHTHALMIC SOLUTION    Place 2 drops into both eyes every 4 (four) hours while awake. For 5 days     Note:  This document was prepared using Dragon voice recognition software and may include unintentional dictation errors.    Faythe Ghee, PA-C 12/08/18 1223    Rockne Menghini, MD 12/08/18 [phone]

## 2018-12-30 ENCOUNTER — Ambulatory Visit: Payer: Medicaid Other | Admitting: Physician Assistant

## 2018-12-30 ENCOUNTER — Encounter: Payer: Self-pay | Admitting: Physician Assistant

## 2018-12-30 VITALS — BP 137/77 | HR 102 | Temp 98.3°F | Resp 16 | Wt 242.0 lb

## 2018-12-30 DIAGNOSIS — F419 Anxiety disorder, unspecified: Secondary | ICD-10-CM

## 2018-12-30 MED ORDER — SERTRALINE HCL 50 MG PO TABS: 50.0000 mg | ORAL_TABLET | Freq: Every day | ORAL | 0 refills | Status: DC

## 2018-12-30 NOTE — Patient Instructions (Signed)

## 2018-12-30 NOTE — Progress Notes (Signed)
       Patient: [name] Female    DOB: March 07, 1991   28 y.o.   MRN: [id number] Visit Date: 12/30/2018  Today's Provider: Trey Sailors, PA-C   Chief Complaint  Patient presents with  . Anxiety   Subjective:     HPI Patient here today c/o anxiety worsening in the last few months. Patient reports she is having a hard time dealing with stress from work , family and church. Patient reports she has mood swings. Sister in laq aged 93 living with her. Stress from leadership at church. 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   Reports mother takes half pill of anxiety and depression since brother died, does not know what this medication is.   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 Reports irritability, high levels of anxiety and worry.    No Known Allergies   Current Outpatient Medications:  .  triamcinolone cream (KENALOG) 0.1 %, Apply 1 application topically 2 (two) times daily., Disp: 30 g, Rfl: 0  Review of Systems  Constitutional: Negative.   Psychiatric/Behavioral: Positive for agitation. The patient is nervous/anxious.     Social History   Tobacco Use  . Smoking status: Former Smoker    Last attempt to quit: 11/24/2014    Years since quitting: 4.1  . Smokeless tobacco: Never Used  Substance Use Topics  . Alcohol use: No      Objective:   BP 137/77 (BP Location: Left Arm, Patient Position: Sitting, Cuff Size: Normal)   Pulse (!) 102   Temp 98.3 F (36.8 C) (Oral)   Resp 16   Wt 242 lb (109.8 kg)   LMP 12/02/2018 (Approximate)   BMI 39.06 kg/m  Vitals:   12/30/18 1548  BP: 137/77  Pulse: (!) 102  Resp: 16  Temp: 98.3 F (36.8 C)  TempSrc: Oral  Weight: 242 lb (109.8 kg)     Physical Exam Constitutional:      Appearance: Normal appearance.  Cardiovascular:     Rate and Rhythm: Normal rate.  Pulmonary:   Effort: Pulmonary effort is normal.  Skin:    General: Skin is warm and dry.  Neurological:     Mental Status: She is alert and oriented to person, place, and time. Mental status is at baseline.  Psychiatric:        Mood and Affect: Mood is anxious.        Behavior: Behavior normal.         Assessment & Plan    1. Anxiety  - sertraline (ZOLOFT) 50 MG tablet; Take 1 tablet (50 mg total) by mouth daily.  Dispense: 90 tablet; Refill: 0  Return in about 6 weeks (around 02/10/2019) for anxiety .  The entirety of the information documented in the History of Present Illness, Review of Systems and Physical Exam were personally obtained by me. Portions of this information were initially documented by Rondel Baton, CMA and reviewed by me for thoroughness and accuracy.      Trey Sailors, PA-C  [facility]

## 2019-01-04 ENCOUNTER — Encounter: Payer: Self-pay | Admitting: Physician Assistant

## 2019-01-07 ENCOUNTER — Ambulatory Visit (INDEPENDENT_AMBULATORY_CARE_PROVIDER_SITE_OTHER): Payer: Medicaid Other | Admitting: Certified Nurse Midwife

## 2019-01-07 ENCOUNTER — Encounter: Payer: Self-pay | Admitting: Certified Nurse Midwife

## 2019-01-07 VITALS — BP 122/84 | HR 73 | Ht 65.0 in | Wt 236.3 lb

## 2019-01-07 DIAGNOSIS — N912 Amenorrhea, unspecified: Secondary | ICD-10-CM | POA: Diagnosis not present

## 2019-01-07 LAB — POCT URINE PREGNANCY: Preg Test, Ur: POSITIVE — AB

## 2019-01-07 MED ORDER — DOXYLAMINE-PYRIDOXINE 10-10 MG PO TBEC: 1.0000 | DELAYED_RELEASE_TABLET | Freq: Two times a day (BID) | ORAL | 6 refills | Status: DC

## 2019-01-07 NOTE — Progress Notes (Addendum)
GYN ENCOUNTER NOTE  Subjective:       [name] is a 28 y.o. [id number] female here for pregnancy confirmation.   Reports positive home pregnancy test. Endorses daily nausea without vomiting.   Denies difficulty breathing or respiratory distress. Chest pain, abdominal pain, vaginal bleeding, dysuria, and leg pain or swelling.    Gynecologic History  Patient's last menstrual period was 11/29/2018 (exact date). Period Cycle (Days): 28 Period Duration (Days): 5 Period Pattern: Regular Menstrual Flow: Moderate Menstrual Control: Tampon Dysmenorrhea: (!) Mild Dysmenorrhea Symptoms: Cramping   EDD: 09/05/2019  Gestational age: 59 weeks 4 days  Contraception: none  Last Pap:  Due.   Obstetric History  OB History  Gravida Para Term Preterm AB Living  2 1 1     1   SAB TAB Ectopic Multiple Live Births          1    # Outcome Date GA Lbr Len/2nd Weight Sex Delivery Anes PTL Lv  2 Current           1 Term 05/03/17 [redacted]w[redacted]d  6 lb 11 oz (3.033 kg) F Vag-Spont   LIV    No past medical history on file.  Past Surgical History:  Procedure Laterality Date  . WISDOM TOOTH EXTRACTION      Current Outpatient Medications on File Prior to Visit  Medication Sig Dispense Refill  . Famotidine 20 MG CHEW Chew 1 tablet by mouth daily.    . sertraline (ZOLOFT) 50 MG tablet Take 1 tablet (50 mg total) by mouth daily. 90 tablet 0  . triamcinolone cream (KENALOG) 0.1 % Apply 1 application topically 2 (two) times daily. 30 g 0   No current facility-administered medications on file prior to visit.     No Known Allergies  Social History   Socioeconomic History  . Marital status: Married    Spouse name: Not on file  . Number of children: Not on file  . Years of education: Not on file  . Highest education level: Not on file  Occupational History  . Not on file  Social Needs  . Financial resource strain: Not on file  . Food insecurity:    Worry: Not on file    Inability: Not on  file  . Transportation needs:    Medical: Not on file    Non-medical: Not on file  Tobacco Use  . Smoking status: Former Smoker    Last attempt to quit: 11/24/2014    Years since quitting: 4.1  . Smokeless tobacco: Never Used  Substance and Sexual Activity  . Alcohol use: No  . Drug use: No  . Sexual activity: Yes    Partners: Male    Birth control/protection: None  Lifestyle  . Physical activity:    Days per week: Not on file    Minutes per session: Not on file  . Stress: Not on file  Relationships  . Social connections:    Talks on phone: Not on file    Gets together: Not on file    Attends religious service: Not on file    Active member of club or organization: Not on file    Attends meetings of clubs or organizations: Not on file    Relationship status: Not on file  . Intimate partner violence:    Fear of current or ex partner: Not on file    Emotionally abused: Not on file    Physically abused: Not on file    Forced sexual activity: Not on  file  Other Topics Concern  . Not on file  Social History Narrative  . Not on file    Family History  Problem Relation Age of Onset  . Cancer Mother        SKIN CANCER  . Hypertension Father   . Cancer Father        PROSTATE CANCER  . Seizures Brother   . Cancer Maternal Grandmother        BRAIN TUMOR  . Breast cancer Neg Hx   . Ovarian cancer Neg Hx   . Colon cancer Neg Hx     The following portions of the patient's history were reviewed and updated as appropriate: allergies, current medications, past family history, past medical history, past social history, past surgical history and problem list.  Review of Systems  ROS negative except as noted above. Information obtained from patient.   Objective:   BP 122/84   Pulse 73   Ht 5\' 5"  (1.651 m)   Wt 236 lb 4.8 oz (107.2 kg)   LMP 11/29/2018 (Exact Date)   BMI 39.32 kg/m    CONSTITUTIONAL: Well-developed, well-nourished female in no acute distress.    PHYSICAL EXAM: Not indicated.   POCT urine pregnancy     Status: Abnormal   Collection Time: 01/07/19 11:06 AM  Result Value Ref Range   Preg Test, Ur Positive (A) Negative    Assessment:   1. Amenorrhea  - POCT urine pregnancy     Plan:   Encouraged prenatal vitamin with folic acid and DHA; samples given.   First trimester education; see AVS.   Rx: Diclegis, see orders.   Reviewed red flag symptoms and when to call.   RTC x 2-3 weeks for dating/viability ultrasound and nurse intake or sooner if needed.    Gunnar BullaJenkins Michelle Quantrell Splitt, CNM [facility] 01/07/19 12:10 PM

## 2019-01-07 NOTE — Patient Instructions (Signed)
WE WOULD LOVE TO HEAR FROM YOU!!!!   Thank you [name] for visiting [facility].  Providing our patients with the best experience possible is really important to [place], and we hope that you felt that on your recent visit. The most valuable feedback we get comes from [place]!!    If you receive a survey please take a couple of minutes to let us know how we did.Thank you for continuing to trust [place] with your care.   [facility]   Back Pain in Pregnancy Back pain during pregnancy is common. Back pain may be caused by several factors that are related to changes during your pregnancy. Follow these instructions at home: Managing pain, stiffness, and swelling      If directed, for sudden (acute) back pain, put ice on the painful area. ? Put ice in a plastic bag. ? Place a towel between your skin and the bag. ? Leave the ice on for 20 minutes, 2-3 times per day.  If directed, apply heat to the affected area before you exercise. Use the heat source that your health care provider recommends, such as a moist heat pack or a heating pad. ? Place a towel between your skin and the heat source. ? Leave the heat on for 20-30 minutes. ? Remove the heat if your skin turns bright red. This is especially important if you are unable to feel pain, heat, or cold. You may have a greater risk of getting burned.  If directed, massage the affected area. Activity  Exercise as told by your health care provider. Gentle exercise is the best way to prevent or manage back pain.  Listen to your body when lifting. If lifting hurts, ask for help or bend your knees. This uses your leg muscles instead of your back muscles.  Squat down when picking up something from the floor. Do not bend over.  Only use bed rest for short periods as told by your health care provider. Bed rest should only be used for the most severe episodes of back pain. Standing, sitting, and lying  down  Do not stand in one place for long periods of time.  Use good posture when sitting. Make sure your head rests over your shoulders and is not hanging forward. Use a pillow on your lower back if necessary.  Try sleeping on your side, preferably the left side, with a pregnancy support pillow or 1-2 regular pillows between your legs. ? If you have back pain after a night's rest, your bed may be too soft. ? A firm mattress may provide more support for your back during pregnancy. General instructions  Do not wear high heels.  Eat a healthy diet. Try to gain weight within your health care provider's recommendations.  Use a maternity girdle, elastic sling, or back brace as told by your health care provider.  Take over-the-counter and prescription medicines only as told by your health care provider.  Work with a physical therapist or massage therapist to find ways to manage back pain. Acupuncture or massage therapy may be helpful.  Keep all follow-up visits as told by your health care provider. This is important. Contact a health care provider if:  Your back pain interferes with your daily activities.  You have increasing pain in other parts of your body. Get help right away if:  You develop numbness, tingling, weakness, or problems with the use of your arms or legs.  You develop severe back pain that is not  controlled with medicine.  You have a change in bowel or bladder control.  You develop shortness of breath, dizziness, or you faint.  You develop nausea, vomiting, or sweating.  You have back pain that is a rhythmic, cramping pain similar to labor pains. Labor pain is usually 1-2 minutes apart, lasts for about 1 minute, and involves a bearing down feeling or pressure in your pelvis.  You have back pain and your water breaks or you have vaginal bleeding.  You have back pain or numbness that travels down your leg.  Your back pain developed after you fell.  You develop  pain on one side of your back.  You see blood in your urine.  You develop skin blisters in the area of your back pain. Summary  Back pain may be caused by several factors that are related to changes during your pregnancy.  Follow instructions as told by your health care provider for managing pain, stiffness, and swelling.  Exercise as told by your health care provider. Gentle exercise is the best way to prevent or manage back pain.  Take over-the-counter and prescription medicines only as told by your health care provider.  Keep all follow-up visits as told by your health care provider. This is important. This information is not intended to replace advice given to you by your health care provider. Make sure you discuss any questions you have with your health care provider. Document Released: 02/25/2006 Document Revised: 05/05/2018 Document Reviewed: 05/05/2018 Elsevier Interactive Patient Education  2019 [employer]  Morning Sickness  Morning sickness is when you feel sick to your stomach (nauseous) during pregnancy. You may feel sick to your stomach and throw up (vomit). You may feel sick in the morning, but you can feel this way at any time of day. Some women feel very sick to their stomach and cannot stop throwing up (hyperemesis gravidarum). Follow these instructions at home: Medicines  Take over-the-counter and prescription medicines only as told by your doctor. Do not take any medicines until you talk with your doctor about them first.  Taking multivitamins before getting pregnant can stop or lessen the harshness of morning sickness. Eating and drinking  Eat dry toast or crackers before getting out of bed.  Eat 5 or 6 small meals a day.  Eat dry and bland foods like rice and baked potatoes.  Do not eat greasy, fatty, or spicy foods.  Have someone cook for you if the smell of food causes you to feel sick or throw up.  If you feel sick to your stomach after taking  prenatal vitamins, take them at night or with a snack.  Eat protein when you need a snack. Nuts, yogurt, and cheese are good choices.  Drink fluids throughout the day.  Try ginger ale made with real ginger, ginger tea made from fresh grated ginger, or ginger candies. General instructions  Do not use any products that have nicotine or tobacco in them, such as cigarettes and e-cigarettes. If you need help quitting, ask your doctor.  Use an air purifier to keep the air in your house free of smells.  Get lots of fresh air.  Try to avoid smells that make you feel sick.  Try: ? Wearing a bracelet that is used for seasickness (acupressure wristband). ? Going to a doctor who puts thin needles into certain body points (acupuncture) to improve how you feel. Contact a doctor if:  You need medicine to feel better.  You feel dizzy or light-headed.  You are losing weight. Get help right away if:  You feel very sick to your stomach and cannot stop throwing up.  You pass out (faint).  You have very bad pain in your belly. Summary  Morning sickness is when you feel sick to your stomach (nauseous) during pregnancy.  You may feel sick in the morning, but you can feel this way at any time of day.  Making some changes to what you eat may help your symptoms go away. This information is not intended to replace advice given to you by your health care provider. Make sure you discuss any questions you have with your health care provider. Document Released: 12/25/2004 Document Revised: 12/18/2016 Document Reviewed: 12/18/2016 Elsevier Interactive Patient Education  2019 [employer].   Common Medications Safe in Pregnancy  Acne:      Constipation:  Benzoyl Peroxide     Colace  Clindamycin      Dulcolax Suppository  Topica Erythromycin     Fibercon  Salicylic Acid      Metamucil         Miralax AVOID:        Senakot   Accutane    Cough:  Retin-A       Cough  Drops  Tetracycline      Phenergan w/ Codeine if Rx  Minocycline      Robitussin (Plain & DM)  Antibiotics:     Crabs/Lice:  Ceclor       RID  Cephalosporins    AVOID:  E-Mycins      Kwell  Keflex  Macrobid/Macrodantin   Diarrhea:  Penicillin      Kao-Pectate  Zithromax      Imodium AD         PUSH FLUIDS AVOID:       Cipro     Fever:  Tetracycline      Tylenol (Regular or Extra  Minocycline       Strength)  Levaquin      Extra Strength-Do not          Exceed 8 tabs/24 hrs Caffeine:        <249m/day (equiv. To 1 cup of coffee or  approx. 3 12 oz sodas)         Gas: Cold/Hayfever:       Gas-X  Benadryl      Mylicon  Claritin       Phazyme  **Claritin-D        Chlor-Trimeton    Headaches:  Dimetapp      ASA-Free Excedrin  Drixoral-Non-Drowsy     Cold Compress  Mucinex (Guaifenasin)     Tylenol (Regular or Extra  Sudafed/Sudafed-12 Hour     Strength)  **Sudafed PE Pseudoephedrine   Tylenol Cold & Sinus     Vicks Vapor Rub  Zyrtec  **AVOID if Problems With Blood Pressure         Heartburn: Avoid lying down for at least 1 hour after meals  Aciphex      Maalox     Rash:  Milk of Magnesia     Benadryl    Mylanta       1% Hydrocortisone Cream  Pepcid  Pepcid Complete   Sleep Aids:  Prevacid      Ambien   Prilosec       Benadryl  Rolaids       Chamomile Tea  Tums (Limit 4/day)     Unisom  Zantac       Tylenol PM  Warm milk-add vanilla or  Hemorrhoids:       Sugar for taste  Anusol/Anusol H.C.  (RX: Analapram 2.5%)  Sugar Substitutes:  Hydrocortisone OTC     Ok in moderation  Preparation H      Tucks        Vaseline lotion applied to tissue with wiping    Herpes:     Throat:  Acyclovir      Oragel  Famvir  Valtrex     Vaccines:         Flu Shot Leg Cramps:       *Gardasil  Benadryl      Hepatitis A         Hepatitis B Nasal Spray:       Pneumovax  Saline Nasal Spray     Polio Booster         Tetanus Nausea:       Tuberculosis test or  PPD  Vitamin B6 25 mg TID   AVOID:    Dramamine      *Gardasil  Emetrol       Live Poliovirus  Ginger Root 250 mg QID    MMR (measles, mumps &  High Complex Carbs @ Bedtime    rebella)  Sea Bands-Accupressure    Varicella (Chickenpox)  Unisom 1/2 tab TID     *No known complications           If received before Pain:         Known pregnancy;   Darvocet       Resume series after  Lortab        Delivery  Percocet    Yeast:   Tramadol      Femstat  Tylenol 3      Gyne-lotrimin  Ultram       Monistat  Vicodin           MISC:         All Sunscreens           Hair Coloring/highlights          Insect Repellant's          (Including DEET)         Mystic Tans  First Trimester of Pregnancy  The first trimester of pregnancy is from week 1 until the end of week 13 (months 1 through 3). During this time, your baby will begin to develop inside you. At 6-8 weeks, the eyes and face are formed, and the heartbeat can be seen on ultrasound. At the end of 12 weeks, all the baby's organs are formed. Prenatal care is all the medical care you receive before the birth of your baby. Make sure you get good prenatal care and follow all of your doctor's instructions. Follow these instructions at home: Medicines  Take over-the-counter and prescription medicines only as told by your doctor. Some medicines are safe and some medicines are not safe during pregnancy.  Take a prenatal vitamin that contains at least 600 micrograms (mcg) of folic acid.  If you have trouble pooping (constipation), take medicine that will make your stool soft (stool softener) if your doctor approves. Eating and drinking   Eat regular, healthy meals.  Your doctor will tell you the amount of weight gain that is right for you.  Avoid raw meat and uncooked cheese.  If you feel sick to your stomach (nauseous) or throw up (vomit): ? Eat 4 or 5 small meals a day instead of 3 large meals. ?  Try eating a few soda crackers. ? Drink  liquids between meals instead of during meals.  To prevent constipation: ? Eat foods that are high in fiber, like fresh fruits and vegetables, whole grains, and beans. ? Drink enough fluids to keep your pee (urine) clear or pale yellow. Activity  Exercise only as told by your doctor. Stop exercising if you have cramps or pain in your lower belly (abdomen) or low back.  Do not exercise if it is too hot, too humid, or if you are in a place of great height (high altitude).  Try to avoid standing for long periods of time. Move your legs often if you must stand in one place for a long time.  Avoid heavy lifting.  Wear low-heeled shoes. Sit and stand up straight.  You can have sex unless your doctor tells you not to. Relieving pain and discomfort  Wear a good support bra if your breasts are sore.  Take warm water baths (sitz baths) to soothe pain or discomfort caused by hemorrhoids. Use hemorrhoid cream if your doctor says it is okay.  Rest with your legs raised if you have leg cramps or low back pain.  If you have puffy, bulging veins (varicose veins) in your legs: ? Wear support hose or compression stockings as told by your doctor. ? Raise (elevate) your feet for 15 minutes, 3-4 times a day. ? Limit salt in your food. Prenatal care  Schedule your prenatal visits by the twelfth week of pregnancy.  Write down your questions. Take them to your prenatal visits.  Keep all your prenatal visits as told by your doctor. This is important. Safety  Wear your seat belt at all times when driving.  Make a list of emergency phone numbers. The list should include numbers for family, friends, the hospital, and police and fire departments. General instructions  Ask your doctor for a referral to a local prenatal class. Begin classes no later than at the start of month 6 of your pregnancy.  Ask for help if you need counseling or if you need help with nutrition. Your doctor can give you advice  or tell you where to go for help.  Do not use hot tubs, steam rooms, or saunas.  Do not douche or use tampons or scented sanitary pads.  Do not cross your legs for long periods of time.  Avoid all herbs and alcohol. Avoid drugs that are not approved by your doctor.  Do not use any tobacco products, including cigarettes, chewing tobacco, and electronic cigarettes. If you need help quitting, ask your doctor. You may get counseling or other support to help you quit.  Avoid cat litter boxes and soil used by cats. These carry germs that can cause birth defects in the baby and can cause a loss of your baby (miscarriage) or stillbirth.  Visit your dentist. At home, brush your teeth with a soft toothbrush. Be gentle when you floss. Contact a doctor if:  You are dizzy.  You have mild cramps or pressure in your lower belly.  You have a nagging pain in your belly area.  You continue to feel sick to your stomach, you throw up, or you have watery poop (diarrhea).  You have a bad smelling fluid coming from your vagina.  You have pain when you pee (urinate).  You have increased puffiness (swelling) in your face, hands, legs, or ankles. Get help right away if:  You have a fever.  You are leaking fluid from  your vagina.  You have spotting or bleeding from your vagina.  You have very bad belly cramping or pain.  You gain or lose weight rapidly.  You throw up blood. It may look like coffee grounds.  You are around people who have [place] measles, fifth disease, or chickenpox.  You have a very bad headache.  You have shortness of breath.  You have any kind of trauma, such as from a fall or a car accident. Summary  The first trimester of pregnancy is from week 1 until the end of week 13 (months 1 through 3).  To take care of yourself and your unborn baby, you will need to eat healthy meals, take medicines only if your doctor tells you to do so, and do activities that are safe for you  and your baby.  Keep all follow-up visits as told by your doctor. This is important as your doctor will have to ensure that your baby is healthy and growing well. This information is not intended to replace advice given to you by your health care provider. Make sure you discuss any questions you have with your health care provider. Document Released: 05/05/2008 Document Revised: 11/25/2016 Document Reviewed: 11/25/2016 Elsevier Interactive Patient Education  2019 [employer].

## 2019-01-07 NOTE — Progress Notes (Signed)
Patient here for pregnancy confirmation.  Patient c/o daily nausea, no vomiting.

## 2019-01-07 NOTE — Addendum Note (Signed)
Addended by: Shaune Spittle on: 01/07/2019 12:20 PM   Modules accepted: Orders

## 2019-01-14 NOTE — Progress Notes (Signed)
      [name] presents for NOB nurse intake visit. Pregnancy confirmation done at [facility] with JML on 01/07/2019.  G2.  P1001.  LMP- 11/29/2018.  EDD- 09/05/19.  Ga- 7 1/7.  Dating scan after intake. Pregnancy education material explained and given.  __0_ cats in the home.  NOB labs ordered. BMI greater than 30. TSH/HbgA1c ordered.  HIV and drug screen explained and ordered.. Genetic screening discussed. Genetic testing-Unsure. Leaning towards no but will discuss with JML. Pt to discuss genetic testing with provider. PNV encouraged. Per Pt request Citra natal [place] ordered. Mild nausea. Diclegis rx given last visit. Declines Flu vaccine. Pt to follow up with provider in ___5__weeks for NOB physical. Pt states she did not have a good experience last delivery at [facility]. She wishes to delivery at [facility]. Informed pt that we do not delivery at [facility]. Encouraged to discuss with JML at [place].  [facility] Financial Policy explained and reviewed. FMLA form signed and explained.

## 2019-01-14 NOTE — Patient Instructions (Signed)
WHAT OB PATIENTS CAN EXPECT   Confirmation of pregnancy and ultrasound ordered if medically indicated-[redacted] weeks gestation  New OB (NOB) intake with nurse and New OB (NOB) labs- [redacted] weeks gestation  New OB (NOB) physical examination with provider- 11/[redacted] weeks gestation  Flu vaccine-[redacted] weeks gestation  Anatomy scan-[redacted] weeks gestation  Glucose tolerance test, blood work to test for anemia, T-dap vaccine-[redacted] weeks gestation  Vaginal swabs/cultures-STD/Group B strep-[redacted] weeks gestation  Appointments every 4 weeks until 28 weeks  Every 2 weeks from 28 weeks until 36 weeks  Weekly visits from 36 weeks until delivery  Morning Sickness  Morning sickness is when you feel sick to your stomach (nauseous) during pregnancy. You may feel sick to your stomach and throw up (vomit). You may feel sick in the morning, but you can feel this way at any time of day. Some women feel very sick to their stomach and cannot stop throwing up (hyperemesis gravidarum). Follow these instructions at home: Medicines  Take over-the-counter and prescription medicines only as told by your doctor. Do not take any medicines until you talk with your doctor about them first.  Taking multivitamins before getting pregnant can stop or lessen the harshness of morning sickness. Eating and drinking  Eat dry toast or crackers before getting out of bed.  Eat 5 or 6 small meals a day.  Eat dry and bland foods like rice and baked potatoes.  Do not eat greasy, fatty, or spicy foods.  Have someone cook for you if the smell of food causes you to feel sick or throw up.  If you feel sick to your stomach after taking prenatal vitamins, take them at night or with a snack.  Eat protein when you need a snack. Nuts, yogurt, and cheese are good choices.  Drink fluids throughout the day.  Try ginger ale made with real ginger, ginger tea made from fresh grated ginger, or ginger candies. General instructions  Do not use any products  that have nicotine or tobacco in them, such as cigarettes and e-cigarettes. If you need help quitting, ask your doctor.  Use an air purifier to keep the air in your house free of smells.  Get lots of fresh air.  Try to avoid smells that make you feel sick.  Try: ? Wearing a bracelet that is used for seasickness (acupressure wristband). ? Going to a doctor who puts thin needles into certain body points (acupuncture) to improve how you feel. Contact a doctor if:  You need medicine to feel better.  You feel dizzy or light-headed.  You are losing weight. Get help right away if:  You feel very sick to your stomach and cannot stop throwing up.  You pass out (faint).  You have very bad pain in your belly. Summary  Morning sickness is when you feel sick to your stomach (nauseous) during pregnancy.  You may feel sick in the morning, but you can feel this way at any time of day.  Making some changes to what you eat may help your symptoms go away. This information is not intended to replace advice given to you by your health care provider. Make sure you discuss any questions you have with your health care provider. Document Released: 12/25/2004 Document Revised: 12/18/2016 Document Reviewed: 12/18/2016 Elsevier Interactive Patient Education  2019 [employer]. How a Baby Grows During Pregnancy  Pregnancy begins when a female's sperm enters a female's egg (fertilization). Fertilization usually happens in one of the tubes (fallopian tubes) that connect  the ovaries to the womb (uterus). The fertilized egg moves down the fallopian tube to the uterus. Once it reaches the uterus, it implants into the lining of the uterus and begins to grow. For the first 10 weeks, the fertilized egg is called an embryo. After 10 weeks, it is called a fetus. As the fetus continues to grow, it receives oxygen and nutrients through tissue (placenta) that grows to support the developing baby. The placenta is the life  support system for the baby. It provides oxygen and nutrition and removes waste. Learning as much as you can about your pregnancy and how your baby is developing can help you enjoy the experience. It can also make you aware of when there might be a problem and when to ask questions. How long does a typical pregnancy last? A pregnancy usually lasts 280 days, or about 40 weeks. Pregnancy is divided into three periods of growth, also called trimesters:  First trimester: 0-12 weeks.  Second trimester: 13-27 weeks.  Third trimester: 28-40 weeks. The day when your baby is ready to be born (full term) is your estimated date of delivery. How does my baby develop month by month? First month  The fertilized egg attaches to the inside of the uterus.  Some cells will form the placenta. Others will form the fetus.  The arms, legs, brain, spinal cord, lungs, and heart begin to develop.  At the end of the first month, the heart begins to beat. Second month  The bones, inner ear, eyelids, hands, and feet form.  The genitals develop.  By the end of 8 weeks, all major organs are developing. Third month  All of the internal organs are forming.  Teeth develop below the gums.  Bones and muscles begin to grow. The spine can flex.  The skin is transparent.  Fingernails and toenails begin to form.  Arms and legs continue to grow longer, and hands and feet develop.  The fetus is about 3 inches (7.6 cm) long. Fourth month  The placenta is completely formed.  The external sex organs, neck, outer ear, eyebrows, eyelids, and fingernails are formed.  The fetus can hear, swallow, and move its arms and legs.  The kidneys begin to produce urine.  The skin is covered with a white, waxy coating (vernix) and very fine hair (lanugo). Fifth month  The fetus moves around more and can be felt for the first time (quickening).  The fetus starts to sleep and wake up and may begin to suck its  finger.  The nails grow to the end of the fingers.  The organ in the digestive system that makes bile (gallbladder) functions and helps to digest nutrients.  If your baby is a girl, eggs are present in her ovaries. If your baby is a boy, testicles start to move down into his scrotum. Sixth month  The lungs are formed.  The eyes open. The brain continues to develop.  Your baby has fingerprints and toe prints. Your baby's hair grows thicker.  At the end of the second trimester, the fetus is about 9 inches (22.9 cm) long. Seventh month  The fetus kicks and stretches.  The eyes are developed enough to sense changes in light.  The hands can make a grasping motion.  The fetus responds to sound. Eighth month  All organs and body systems are fully developed and functioning.  Bones harden, and taste buds develop. The fetus may hiccup.  Certain areas of the brain are still developing.  The skull remains soft. Ninth month  The fetus gains about  lb (0.23 kg) each week.  The lungs are fully developed.  Patterns of sleep develop.  The fetus's head typically moves into a head-down position (vertex) in the uterus to prepare for birth.  The fetus weighs 6-9 lb (2.72-4.08 kg) and is 19-20 inches (48.26-50.8 cm) long. What can I do to have a healthy pregnancy and help my baby develop? General instructions  Take prenatal vitamins as directed by your health care provider. These include vitamins such as folic acid, iron, calcium, and vitamin D. They are important for healthy development.  Take medicines only as directed by your health care provider. Read labels and ask a pharmacist or your health care provider whether over-the-counter medicines, supplements, and prescription drugs are safe to take during pregnancy.  Keep all follow-up visits as directed by your health care provider. This is important. Follow-up visits include prenatal care and screening tests. How do I know if my baby  is developing well? At each prenatal visit, your health care provider will do several different tests to check on your health and keep track of your baby's development. These include:  Fundal height and position. ? Your health care provider will measure your growing belly from your pubic bone to the top of the uterus using a tape measure. ? Your health care provider will also feel your belly to determine your baby's position.  Heartbeat. ? An ultrasound in the first trimester can confirm pregnancy and show a heartbeat, depending on how far along you are. ? Your health care provider will check your baby's heart rate at every prenatal visit.  Second trimester ultrasound. ? This ultrasound checks your baby's development. It also may show your baby's gender. What should I do if I have concerns about my baby's development? Always talk with your health care provider about any concerns that you may have about your pregnancy and your baby. Summary  A pregnancy usually lasts 280 days, or about 40 weeks. Pregnancy is divided into three periods of growth, also called trimesters.  Your health care provider will monitor your baby's growth and development throughout your pregnancy.  Follow your health care provider's recommendations about taking prenatal vitamins and medicines during your pregnancy.  Talk with your health care provider if you have any concerns about your pregnancy or your developing baby. This information is not intended to replace advice given to you by your health care provider. Make sure you discuss any questions you have with your health care provider. Document Released: 05/05/2008 Document Revised: 09/30/2017 Document Reviewed: 09/30/2017 Elsevier Interactive Patient Education  2019 [employer]. Commonly Asked Questions During Pregnancy  Cats: A parasite can be excreted in cat feces.  To avoid exposure you need to have another person empty the little box.  If you must empty the  litter box you will need to wear gloves.  Wash your hands after handling your cat.  This parasite can also be found in raw or undercooked meat so this should also be avoided.  Colds, Sore Throats, Flu: Please check your medication sheet to see what you can take for symptoms.  If your symptoms are unrelieved by these medications please call the office.  Dental Work: Most any dental work Investment banker, corporate recommends is permitted.  X-rays should only be taken during the first trimester if absolutely necessary.  Your abdomen should be shielded with a lead apron during all x-rays.  Please notify your provider prior to receiving  any x-rays.  Novocaine is fine; gas is not recommended.  If your dentist requires a note from [place] prior to dental work please call the office and we will provide one for you.  Exercise: Exercise is an important part of staying healthy during your pregnancy.  You may continue most exercises you were accustomed to prior to pregnancy.  Later in your pregnancy you will most likely notice you have difficulty with activities requiring balance like riding a bicycle.  It is important that you listen to your body and avoid activities that put you at a higher risk of falling.  Adequate rest and staying well hydrated are a must!  If you have questions about the safety of specific activities ask your provider.    Exposure to Children with illness: Try to avoid obvious exposure; report any symptoms to [place] when noted,  If you have chicken pos, red measles or mumps, you should be immune to these diseases.   Please do not take any vaccines while pregnant unless you have checked with your OB provider.  Fetal Movement: After 28 weeks we recommend you do "kick counts" twice daily.  Lie or sit down in a calm quiet environment and count your baby movements "kicks".  You should feel your baby at least 10 times per hour.  If you have not felt 10 kicks within the first hour get up, walk around and have something sweet  to eat or drink then repeat for an additional hour.  If count remains less than 10 per hour notify your provider.  Fumigating: Follow your pest control agent's advice as to how long to stay out of your home.  Ventilate the area well before re-entering.  Hemorrhoids:   Most over-the-counter preparations can be used during pregnancy.  Check your medication to see what is safe to use.  It is important to use a stool softener or fiber in your diet and to drink lots of liquids.  If hemorrhoids seem to be getting worse please call the office.   Hot Tubs:  Hot tubs Jacuzzis and saunas are not recommended while pregnant.  These increase your internal body temperature and should be avoided.  Intercourse:  Sexual intercourse is safe during pregnancy as long as you are comfortable, unless otherwise advised by your provider.  Spotting may occur after intercourse; report any bright red bleeding that is heavier than spotting.  Labor:  If you know that you are in labor, please go to the hospital.  If you are unsure, please call the office and let us help you decide what to do.  Lifting, straining, etc:  If your job requires heavy lifting or straining please check with your provider for any limitations.  Generally, you should not lift items heavier than that you can lift simply with your hands and arms (no back muscles)  Painting:  Paint fumes do not harm your pregnancy, but may make you ill and should be avoided if possible.  Latex or water based paints have less odor than oils.  Use adequate ventilation while painting.  Permanents & Hair Color:  Chemicals in hair dyes are not recommended as they cause increase hair dryness which can increase hair loss during pregnancy.  " Highlighting" and permanents are allowed.  Dye may be absorbed differently and permanents may not hold as well during pregnancy.  Sunbathing:  Use a sunscreen, as skin burns easily during pregnancy.  Drink plenty of fluids; avoid over  heating.  Tanning Beds:  Because their  possible side effects are still unknown, tanning beds are not recommended.  Ultrasound Scans:  Routine ultrasounds are performed at approximately 20 weeks.  You will be able to see your baby's general anatomy an if you would like to know the gender this can usually be determined as well.  If it is questionable when you conceived you may also receive an ultrasound early in your pregnancy for dating purposes.  Otherwise ultrasound exams are not routinely performed unless there is a medical necessity.  Although you can request a scan we ask that you pay for it when conducted because insurance does not cover " patient request" scans.  Work: If your pregnancy proceeds without complications you may work until your due date, unless your physician or employer advises otherwise.  Round Ligament Pain/Pelvic Discomfort:  Sharp, shooting pains not associated with bleeding are fairly common, usually occurring in the second trimester of pregnancy.  They tend to be worse when standing up or when you remain standing for long periods of time.  These are the result of pressure of certain pelvic ligaments called "round ligaments".  Rest, Tylenol and heat seem to be the most effective relief.  As the womb and fetus grow, they rise out of the pelvis and the discomfort improves.  Please notify the office if your pain seems different than that described.  It may represent a more serious condition.  Common Medications Safe in Pregnancy  Acne:      Constipation:  Benzoyl Peroxide     Colace  Clindamycin      Dulcolax Suppository  Topica Erythromycin     Fibercon  Salicylic Acid      Metamucil         Miralax AVOID:        Senakot   Accutane    Cough:  Retin-A       Cough Drops  Tetracycline      Phenergan w/ Codeine if Rx  Minocycline      Robitussin (Plain &  DM)  Antibiotics:     Crabs/Lice:  Ceclor       RID  Cephalosporins    AVOID:  E-Mycins      Kwell  Keflex  Macrobid/Macrodantin   Diarrhea:  Penicillin      Kao-Pectate  Zithromax      Imodium AD         PUSH FLUIDS AVOID:       Cipro     Fever:  Tetracycline      Tylenol (Regular or Extra  Minocycline       Strength)  Levaquin      Extra Strength-Do not          Exceed 8 tabs/24 hrs Caffeine:        '200mg'$ /day (equiv. To 1 cup of coffee or  approx. 3 12 oz sodas)         Gas: Cold/Hayfever:       Gas-X  Benadryl      Mylicon  Claritin       Phazyme  **Claritin-D        Chlor-Trimeton    Headaches:  Dimetapp      ASA-Free Excedrin  Drixoral-Non-Drowsy     Cold Compress  Mucinex (Guaifenasin)     Tylenol (Regular or Extra  Sudafed/Sudafed-12 Hour     Strength)  **Sudafed PE Pseudoephedrine   Tylenol Cold & Sinus     Vicks Vapor Rub  Zyrtec  **AVOID if Problems With Blood Pressure  Heartburn: Avoid lying down for at least 1 hour after meals  Aciphex      Maalox     Rash:  Milk of Magnesia     Benadryl    Mylanta       1% Hydrocortisone Cream  Pepcid  Pepcid Complete   Sleep Aids:  Prevacid      Ambien   Prilosec       Benadryl  Rolaids       Chamomile Tea  Tums (Limit 4/day)     Unisom  Zantac       Tylenol PM         Warm milk-add vanilla or  Hemorrhoids:       Sugar for taste  Anusol/Anusol H.C.  (RX: Analapram 2.5%)  Sugar Substitutes:  Hydrocortisone OTC     Ok in moderation  Preparation H      Tucks        Vaseline lotion applied to tissue with wiping    Herpes:     Throat:  Acyclovir      Oragel  Famvir  Valtrex     Vaccines:         Flu Shot Leg Cramps:       *Gardasil  Benadryl      Hepatitis A         Hepatitis B Nasal Spray:       Pneumovax  Saline Nasal Spray     Polio Booster         Tetanus Nausea:       Tuberculosis test or PPD  Vitamin B6 25 mg TID   AVOID:    Dramamine      *Gardasil  Emetrol       Live  Poliovirus  Ginger Root 250 mg QID    MMR (measles, mumps &  High Complex Carbs @ Bedtime    rebella)  Sea Bands-Accupressure    Varicella (Chickenpox)  Unisom 1/2 tab TID     *No known complications           If received before Pain:         Known pregnancy;   Darvocet       Resume series after  Lortab        Delivery  Percocet    Yeast:   Tramadol      Femstat  Tylenol 3      Gyne-lotrimin  Ultram       Monistat  Vicodin           MISC:         All Sunscreens           Hair Coloring/highlights          Insect Repellant's          (Including DEET)         Mystic Tans

## 2019-01-18 ENCOUNTER — Ambulatory Visit (INDEPENDENT_AMBULATORY_CARE_PROVIDER_SITE_OTHER): Payer: Medicaid Other | Admitting: Certified Nurse Midwife

## 2019-01-18 ENCOUNTER — Ambulatory Visit (INDEPENDENT_AMBULATORY_CARE_PROVIDER_SITE_OTHER): Payer: Medicaid Other

## 2019-01-18 VITALS — BP 131/89 | HR 93 | Ht 65.0 in | Wt 237.2 lb

## 2019-01-18 DIAGNOSIS — O3481 Maternal care for other abnormalities of pelvic organs, first trimester: Secondary | ICD-10-CM | POA: Diagnosis not present

## 2019-01-18 DIAGNOSIS — R638 Other symptoms and signs concerning food and fluid intake: Secondary | ICD-10-CM

## 2019-01-18 DIAGNOSIS — Z113 Encounter for screening for infections with a predominantly sexual mode of transmission: Secondary | ICD-10-CM

## 2019-01-18 DIAGNOSIS — N912 Amenorrhea, unspecified: Secondary | ICD-10-CM

## 2019-01-18 DIAGNOSIS — Z3481 Encounter for supervision of other normal pregnancy, first trimester: Secondary | ICD-10-CM

## 2019-01-18 DIAGNOSIS — Z3A01 Less than 8 weeks gestation of pregnancy: Secondary | ICD-10-CM

## 2019-01-18 DIAGNOSIS — O219 Vomiting of pregnancy, unspecified: Secondary | ICD-10-CM

## 2019-01-18 DIAGNOSIS — Z0283 Encounter for blood-alcohol and blood-drug test: Secondary | ICD-10-CM

## 2019-01-18 DIAGNOSIS — N8311 Corpus luteum cyst of right ovary: Secondary | ICD-10-CM | POA: Diagnosis not present

## 2019-01-18 MED ORDER — CITRANATAL HARMONY 27-1-260 MG PO CAPS: 260.0000 mg | ORAL_CAPSULE | Freq: Every day | ORAL | 11 refills | Status: DC

## 2019-01-19 LAB — URINALYSIS, ROUTINE W REFLEX MICROSCOPIC
Bilirubin, UA: NEGATIVE
Glucose, UA: NEGATIVE
Ketones, UA: NEGATIVE
Leukocytes, UA: NEGATIVE
Nitrite, UA: NEGATIVE
RBC, UA: NEGATIVE
Specific Gravity, UA: >=1.03 — AB (ref 1.005–1.030)
Urobilinogen, Ur: 1 mg/dL (ref 0.2–1.0)
pH, UA: 6 (ref 5.0–7.5)

## 2019-01-19 LAB — ABO AND RH: Rh Factor: NEGATIVE

## 2019-01-19 LAB — MICROSCOPIC EXAMINATION
Bacteria, UA: NONE SEEN
Casts: NONE SEEN /lpf
Epithelial Cells (non renal): NONE SEEN /hpf (ref 0–10)

## 2019-01-19 LAB — HIV ANTIBODY (ROUTINE TESTING W REFLEX): HIV Screen 4th Generation wRfx: NONREACTIVE

## 2019-01-19 LAB — HEMOGLOBIN A1C
Est. average glucose Bld gHb Est-mCnc: 100 mg/dL
HEMOGLOBIN A1C: 5.1 % (ref 4.8–5.6)

## 2019-01-19 LAB — RUBELLA SCREEN: Rubella Antibodies, IGG: 3.48 index (ref >0.99)

## 2019-01-19 LAB — VARICELLA ZOSTER ANTIBODY, IGG: Varicella zoster IgG: 895 index (ref >165)

## 2019-01-19 LAB — HEPATITIS B SURFACE ANTIGEN: Hepatitis B Surface Ag: NEGATIVE

## 2019-01-19 LAB — TSH: TSH: 2.19 u[IU]/mL (ref 0.450–4.500)

## 2019-01-19 LAB — ANTIBODY SCREEN: Antibody Screen: NEGATIVE

## 2019-01-19 LAB — RPR: RPR Ser Ql: NONREACTIVE

## 2019-01-19 NOTE — Addendum Note (Signed)
Addended by: Smith Mince on: 01/19/2019 08:00 AM   Modules accepted: Orders

## 2019-01-20 LAB — SPECIMEN STATUS REPORT

## 2019-01-20 LAB — GC/CHLAMYDIA PROBE AMP
CHLAMYDIA, DNA PROBE: NEGATIVE
Neisseria gonorrhoeae by PCR: NEGATIVE

## 2019-01-20 LAB — URINE CULTURE, OB REFLEX: Organism ID, Bacteria: NO GROWTH

## 2019-01-20 LAB — CULTURE, OB URINE

## 2019-01-20 LAB — ANTIBODY SCREEN: Antibody Screen: NEGATIVE

## 2019-02-22 ENCOUNTER — Encounter: Payer: Medicaid Other | Admitting: Certified Nurse Midwife

## 2019-02-24 ENCOUNTER — Encounter: Payer: Self-pay | Admitting: *Deleted

## 2019-02-25 ENCOUNTER — Other Ambulatory Visit: Payer: Self-pay

## 2019-02-25 ENCOUNTER — Ambulatory Visit (INDEPENDENT_AMBULATORY_CARE_PROVIDER_SITE_OTHER): Payer: Medicaid Other | Admitting: Certified Nurse Midwife

## 2019-02-25 VITALS — BP 131/85 | HR 109 | Wt 228.5 lb

## 2019-02-25 DIAGNOSIS — E669 Obesity, unspecified: Secondary | ICD-10-CM | POA: Insufficient documentation

## 2019-02-25 DIAGNOSIS — O99211 Obesity complicating pregnancy, first trimester: Secondary | ICD-10-CM

## 2019-02-25 DIAGNOSIS — Z6791 Unspecified blood type, Rh negative: Secondary | ICD-10-CM | POA: Diagnosis not present

## 2019-02-25 DIAGNOSIS — O26891 Other specified pregnancy related conditions, first trimester: Secondary | ICD-10-CM | POA: Diagnosis not present

## 2019-02-25 DIAGNOSIS — Z3A12 12 weeks gestation of pregnancy: Secondary | ICD-10-CM

## 2019-02-25 DIAGNOSIS — O99210 Obesity complicating pregnancy, unspecified trimester: Secondary | ICD-10-CM

## 2019-02-25 DIAGNOSIS — O99611 Diseases of the digestive system complicating pregnancy, first trimester: Secondary | ICD-10-CM

## 2019-02-25 DIAGNOSIS — Z3481 Encounter for supervision of other normal pregnancy, first trimester: Secondary | ICD-10-CM

## 2019-02-25 DIAGNOSIS — K219 Gastro-esophageal reflux disease without esophagitis: Secondary | ICD-10-CM | POA: Diagnosis not present

## 2019-02-25 DIAGNOSIS — O99619 Diseases of the digestive system complicating pregnancy, unspecified trimester: Secondary | ICD-10-CM

## 2019-02-25 LAB — POCT URINALYSIS DIPSTICK OB
BILIRUBIN UA: NEGATIVE
Blood, UA: NEGATIVE
Glucose, UA: NEGATIVE
Ketones, UA: NEGATIVE
Leukocytes, UA: NEGATIVE
Nitrite, UA: NEGATIVE
Spec Grav, UA: 1.025 (ref 1.010–1.025)
UROBILINOGEN UA: 0.2 U/dL
pH, UA: 6 (ref 5.0–8.0)

## 2019-02-25 MED ORDER — PANTOPRAZOLE SODIUM 20 MG PO TBEC: 20.0000 mg | DELAYED_RELEASE_TABLET | Freq: Every day | ORAL | 3 refills | Status: DC

## 2019-02-25 NOTE — Patient Instructions (Addendum)
Commonly Asked Questions During Pregnancy  Cats: A parasite can be excreted in cat feces.  To avoid exposure you need to have another person empty the little box.  If you must empty the litter box you will need to wear gloves.  Wash your hands after handling your cat.  This parasite can also be found in raw or undercooked meat so this should also be avoided.  Colds, Sore Throats, Flu: Please check your medication sheet to see what you can take for symptoms.  If your symptoms are unrelieved by these medications please call the office.  Dental Work: Most any dental work Investment banker, corporate recommends is permitted.  X-rays should only be taken during the first trimester if absolutely necessary.  Your abdomen should be shielded with a lead apron during all x-rays.  Please notify your provider prior to receiving any x-rays.  Novocaine is fine; gas is not recommended.  If your dentist requires a note from [place] prior to dental work please call the office and we will provide one for you.  Exercise: Exercise is an important part of staying healthy during your pregnancy.  You may continue most exercises you were accustomed to prior to pregnancy.  Later in your pregnancy you will most likely notice you have difficulty with activities requiring balance like riding a bicycle.  It is important that you listen to your body and avoid activities that put you at a higher risk of falling.  Adequate rest and staying well hydrated are a must!  If you have questions about the safety of specific activities ask your provider.    Exposure to Children with illness: Try to avoid obvious exposure; report any symptoms to [place] when noted,  If you have chicken pos, red measles or mumps, you should be immune to these diseases.   Please do not take any vaccines while pregnant unless you have checked with your OB provider.  Fetal Movement: After 28 weeks we recommend you do "kick counts" twice daily.  Lie or sit down in a calm quiet environment and  count your baby movements "kicks".  You should feel your baby at least 10 times per hour.  If you have not felt 10 kicks within the first hour get up, walk around and have something sweet to eat or drink then repeat for an additional hour.  If count remains less than 10 per hour notify your provider.  Fumigating: Follow your pest control agent's advice as to how long to stay out of your home.  Ventilate the area well before re-entering.  Hemorrhoids:   Most over-the-counter preparations can be used during pregnancy.  Check your medication to see what is safe to use.  It is important to use a stool softener or fiber in your diet and to drink lots of liquids.  If hemorrhoids seem to be getting worse please call the office.   Hot Tubs:  Hot tubs Jacuzzis and saunas are not recommended while pregnant.  These increase your internal body temperature and should be avoided.  Intercourse:  Sexual intercourse is safe during pregnancy as long as you are comfortable, unless otherwise advised by your provider.  Spotting may occur after intercourse; report any bright red bleeding that is heavier than spotting.  Labor:  If you know that you are in labor, please go to the hospital.  If you are unsure, please call the office and let us help you decide what to do.  Lifting, straining, etc:  If your job requires heavy  lifting or straining please check with your provider for any limitations.  Generally, you should not lift items heavier than that you can lift simply with your hands and arms (no back muscles)  Painting:  Paint fumes do not harm your pregnancy, but may make you ill and should be avoided if possible.  Latex or water based paints have less odor than oils.  Use adequate ventilation while painting.  Permanents & Hair Color:  Chemicals in hair dyes are not recommended as they cause increase hair dryness which can increase hair loss during pregnancy.  " Highlighting" and permanents are allowed.  Dye may be  absorbed differently and permanents may not hold as well during pregnancy.  Sunbathing:  Use a sunscreen, as skin burns easily during pregnancy.  Drink plenty of fluids; avoid over heating.  Tanning Beds:  Because their possible side effects are still unknown, tanning beds are not recommended.  Ultrasound Scans:  Routine ultrasounds are performed at approximately 20 weeks.  You will be able to see your baby's general anatomy an if you would like to know the gender this can usually be determined as well.  If it is questionable when you conceived you may also receive an ultrasound early in your pregnancy for dating purposes.  Otherwise ultrasound exams are not routinely performed unless there is a medical necessity.  Although you can request a scan we ask that you pay for it when conducted because insurance does not cover " patient request" scans.  Work: If your pregnancy proceeds without complications you may work until your due date, unless your physician or employer advises otherwise.  Round Ligament Pain/Pelvic Discomfort:  Sharp, shooting pains not associated with bleeding are fairly common, usually occurring in the second trimester of pregnancy.  They tend to be worse when standing up or when you remain standing for long periods of time.  These are the result of pressure of certain pelvic ligaments called "round ligaments".  Rest, Tylenol and heat seem to be the most effective relief.  As the womb and fetus grow, they rise out of the pelvis and the discomfort improves.  Please notify the office if your pain seems different than that described.  It may represent a more serious condition.    Round Ligament Pain  The round ligament is a cord of muscle and tissue that helps support the uterus. It can become a source of pain during pregnancy if it becomes stretched or twisted as the baby grows. The pain usually begins in the second trimester (13-28 weeks) of pregnancy, and it can come and go until the  baby is delivered. It is not a serious problem, and it does not cause harm to the baby. Round ligament pain is usually a short, sharp, and pinching pain, but it can also be a dull, lingering, and aching pain. The pain is felt in the lower side of the abdomen or in the groin. It usually starts deep in the groin and moves up to the outside of the hip area. The pain may occur when you:  Suddenly change position, such as quickly going from a sitting to standing position.  Roll over in bed.  Cough or sneeze.  Do physical activity. Follow these instructions at home:   Watch your condition for any changes.  When the pain starts, relax. Then try any of these methods to help with the pain: ? Sitting down. ? Flexing your knees up to your abdomen. ? Lying on your side with one  pillow under your abdomen and another pillow between your legs. ? Sitting in a warm bath for 15-20 minutes or until the pain goes away.  Take over-the-counter and prescription medicines only as told by your health care provider.  Move slowly when you sit down or stand up.  Avoid long walks if they cause pain.  Stop or reduce your physical activities if they cause pain.  Keep all follow-up visits as told by your health care provider. This is important. Contact a health care provider if:  Your pain does not go away with treatment.  You feel pain in your back that you did not have before.  Your medicine is not helping. Get help right away if:  You have a fever or chills.  You develop uterine contractions.  You have vaginal bleeding.  You have nausea or vomiting.  You have diarrhea.  You have pain when you urinate. Summary  Round ligament pain is felt in the lower abdomen or groin. It is usually a short, sharp, and pinching pain. It can also be a dull, lingering, and aching pain.  This pain usually begins in the second trimester (13-28 weeks). It occurs because the uterus is stretching with the growing  baby, and it is not harmful to the baby.  You may notice the pain when you suddenly change position, when you cough or sneeze, or during physical activity.  Relaxing, flexing your knees to your abdomen, lying on one side, or taking a warm bath may help to get rid of the pain.  Get help from your health care provider if the pain does not go away or if you have vaginal bleeding, nausea, vomiting, diarrhea, or painful urination. This information is not intended to replace advice given to you by your health care provider. Make sure you discuss any questions you have with your health care provider. Document Released: 08/26/2008 Document Revised: 05/05/2018 Document Reviewed: 05/05/2018 Elsevier Interactive Patient Education  2019 [employer] Back Pain in Pregnancy Back pain during pregnancy is common. Back pain may be caused by several factors that are related to changes during your pregnancy. Follow these instructions at home: Managing pain, stiffness, and swelling      If directed, for sudden (acute) back pain, put ice on the painful area. ? Put ice in a plastic bag. ? Place a towel between your skin and the bag. ? Leave the ice on for 20 minutes, 2-3 times per day.  If directed, apply heat to the affected area before you exercise. Use the heat source that your health care provider recommends, such as a moist heat pack or a heating pad. ? Place a towel between your skin and the heat source. ? Leave the heat on for 20-30 minutes. ? Remove the heat if your skin turns bright red. This is especially important if you are unable to feel pain, heat, or cold. You may have a greater risk of getting burned.  If directed, massage the affected area. Activity  Exercise as told by your health care provider. Gentle exercise is the best way to prevent or manage back pain.  Listen to your body when lifting. If lifting hurts, ask for help or bend your knees. This uses your leg muscles instead of your  back muscles.  Squat down when picking up something from the floor. Do not bend over.  Only use bed rest for short periods as told by your health care provider. Bed rest should only be used for the most severe  episodes of back pain. Standing, sitting, and lying down  Do not stand in one place for long periods of time.  Use good posture when sitting. Make sure your head rests over your shoulders and is not hanging forward. Use a pillow on your lower back if necessary.  Try sleeping on your side, preferably the left side, with a pregnancy support pillow or 1-2 regular pillows between your legs. ? If you have back pain after a night's rest, your bed may be too soft. ? A firm mattress may provide more support for your back during pregnancy. General instructions  Do not wear high heels.  Eat a healthy diet. Try to gain weight within your health care provider's recommendations.  Use a maternity girdle, elastic sling, or back brace as told by your health care provider.  Take over-the-counter and prescription medicines only as told by your health care provider.  Work with a physical therapist or massage therapist to find ways to manage back pain. Acupuncture or massage therapy may be helpful.  Keep all follow-up visits as told by your health care provider. This is important. Contact a health care provider if:  Your back pain interferes with your daily activities.  You have increasing pain in other parts of your body. Get help right away if:  You develop numbness, tingling, weakness, or problems with the use of your arms or legs.  You develop severe back pain that is not controlled with medicine.  You have a change in bowel or bladder control.  You develop shortness of breath, dizziness, or you faint.  You develop nausea, vomiting, or sweating.  You have back pain that is a rhythmic, cramping pain similar to labor pains. Labor pain is usually 1-2 minutes apart, lasts for about 1  minute, and involves a bearing down feeling or pressure in your pelvis.  You have back pain and your water breaks or you have vaginal bleeding.  You have back pain or numbness that travels down your leg.  Your back pain developed after you fell.  You develop pain on one side of your back.  You see blood in your urine.  You develop skin blisters in the area of your back pain. Summary  Back pain may be caused by several factors that are related to changes during your pregnancy.  Follow instructions as told by your health care provider for managing pain, stiffness, and swelling.  Exercise as told by your health care provider. Gentle exercise is the best way to prevent or manage back pain.  Take over-the-counter and prescription medicines only as told by your health care provider.  Keep all follow-up visits as told by your health care provider. This is important. This information is not intended to replace advice given to you by your health care provider. Make sure you discuss any questions you have with your health care provider. Document Released: 02/25/2006 Document Revised: 05/05/2018 Document Reviewed: 05/05/2018 Elsevier Interactive Patient Education  2019 [employer].   Second Trimester of Pregnancy  The second trimester is from week 14 through week 27 (month 4 through 6). This is often the time in pregnancy that you feel your best. Often times, morning sickness has lessened or quit. You may have more energy, and you may get hungry more often. Your unborn baby is growing rapidly. At the end of the sixth month, he or she is about 9 inches long and weighs about 1 pounds. You will likely feel the baby move between 18 and 20  weeks of pregnancy. Follow these instructions at home: Medicines  Take over-the-counter and prescription medicines only as told by your doctor. Some medicines are safe and some medicines are not safe during pregnancy.  Take a prenatal vitamin that contains  at least 600 micrograms (mcg) of folic acid.  If you have trouble pooping (constipation), take medicine that will make your stool soft (stool softener) if your doctor approves. Eating and drinking   Eat regular, healthy meals.  Avoid raw meat and uncooked cheese.  If you get low calcium from the food you eat, talk to your doctor about taking a daily calcium supplement.  Avoid foods that are high in fat and sugars, such as fried and sweet foods.  If you feel sick to your stomach (nauseous) or throw up (vomit): ? Eat 4 or 5 small meals a day instead of 3 large meals. ? Try eating a few soda crackers. ? Drink liquids between meals instead of during meals.  To prevent constipation: ? Eat foods that are high in fiber, like fresh fruits and vegetables, whole grains, and beans. ? Drink enough fluids to keep your pee (urine) clear or pale yellow. Activity  Exercise only as told by your doctor. Stop exercising if you start to have cramps.  Do not exercise if it is too hot, too humid, or if you are in a place of great height (high altitude).  Avoid heavy lifting.  Wear low-heeled shoes. Sit and stand up straight.  You can continue to have sex unless your doctor tells you not to. Relieving pain and discomfort  Wear a good support bra if your breasts are tender.  Take warm water baths (sitz baths) to soothe pain or discomfort caused by hemorrhoids. Use hemorrhoid cream if your doctor approves.  Rest with your legs raised if you have leg cramps or low back pain.  If you develop puffy, bulging veins (varicose veins) in your legs: ? Wear support hose or compression stockings as told by your doctor. ? Raise (elevate) your feet for 15 minutes, 3-4 times a day. ? Limit salt in your food. Prenatal care  Write down your questions. Take them to your prenatal visits.  Keep all your prenatal visits as told by your doctor. This is important. Safety  Wear your seat belt when  driving.  Make a list of emergency phone numbers, including numbers for family, friends, the hospital, and police and fire departments. General instructions  Ask your doctor about the right foods to eat or for help finding a counselor, if you need these services.  Ask your doctor about local prenatal classes. Begin classes before month 6 of your pregnancy.  Do not use hot tubs, steam rooms, or saunas.  Do not douche or use tampons or scented sanitary pads.  Do not cross your legs for long periods of time.  Visit your dentist if you have not done so. Use a soft toothbrush to brush your teeth. Floss gently.  Avoid all smoking, herbs, and alcohol. Avoid drugs that are not approved by your doctor.  Do not use any products that contain nicotine or tobacco, such as cigarettes and e-cigarettes. If you need help quitting, ask your doctor.  Avoid cat litter boxes and soil used by cats. These carry germs that can cause birth defects in the baby and can cause a loss of your baby (miscarriage) or stillbirth. Contact a doctor if:  You have mild cramps or pressure in your lower belly.  You have pain when  you pee (urinate).  You have bad smelling fluid coming from your vagina.  You continue to feel sick to your stomach (nauseous), throw up (vomit), or have watery poop (diarrhea).  You have a nagging pain in your belly area.  You feel dizzy. Get help right away if:  You have a fever.  You are leaking fluid from your vagina.  You have spotting or bleeding from your vagina.  You have severe belly cramping or pain.  You lose or gain weight rapidly.  You have trouble catching your breath and have chest pain.  You notice sudden or extreme puffiness (swelling) of your face, hands, ankles, feet, or legs.  You have not felt the baby move in over an hour.  You have severe headaches that do not go away when you take medicine.  You have trouble seeing. Summary  The second trimester is  from week 14 through week 27 (months 4 through 6). This is often the time in pregnancy that you feel your best.  To take care of yourself and your unborn baby, you will need to eat healthy meals, take medicines only if your doctor tells you to do so, and do activities that are safe for you and your baby.  Call your doctor if you get sick or if you notice anything unusual about your pregnancy. Also, call your doctor if you need help with the right food to eat, or if you want to know what activities are safe for you. This information is not intended to replace advice given to you by your health care provider. Make sure you discuss any questions you have with your health care provider. Document Released: 02/11/2010 Document Revised: 12/23/2016 Document Reviewed: 12/23/2016 Elsevier Interactive Patient Education  2019 [employer].   Eating Plan for Pregnant Women While you are pregnant, your body requires additional nutrition to help support your growing baby. You also have a higher need for some vitamins and minerals, such as folic acid, calcium, iron, and vitamin D. Eating a healthy, well-balanced diet is very important for your health and your baby's health. Your need for extra calories varies for the three 13-month segments of your pregnancy (trimesters). For most women, it is recommended to consume:  150 extra calories a day during the first trimester.  300 extra calories a day during the second trimester.  300 extra calories a day during the third trimester. What are tips for following this plan?   Do not try to lose weight or go on a diet during pregnancy.  Limit your overall intake of foods that have "empty calories." These are foods that have little nutritional value, such as sweets, desserts, candies, and sugar-sweetened beverages.  Eat a variety of foods (especially fruits and vegetables) to get a full range of vitamins and minerals.  Take a prenatal vitamin to help meet your  additional vitamin and mineral needs during pregnancy, specifically for folic acid, iron, calcium, and vitamin D.  Remember to stay active. Ask your health care provider what types of exercise and activities are safe for you.  Practice good food safety and cleanliness. Wash your hands before you eat and after you prepare raw meat. Wash all fruits and vegetables well before peeling or eating. Taking these actions can help to prevent food-borne illnesses that can be very dangerous to your baby, such as listeriosis. Ask your health care provider for more information about listeriosis. What does 150 extra calories look like? Healthy options that provide 150 extra calories each  day could be any of the following:  6-8 oz (170-230 g) of plain low-fat yogurt with  cup of berries.  1 apple with 2 teaspoons (11 g) of peanut butter.  Cut-up vegetables with  cup (60 g) of hummus.  8 oz (230 mL) or 1 cup of low-fat chocolate milk.  1 stick of string cheese with 1 medium orange.  1 peanut butter and jelly sandwich that is made with one slice of whole-wheat bread and 1 tsp (5 g) of peanut butter. For 300 extra calories, you could eat two of those healthy options each day. What is a healthy amount of weight to gain? The right amount of weight gain for you is based on your BMI before you became pregnant. If your BMI:  Was less than 18 (underweight), you should gain 28-40 lb (13-18 kg).  Was 18-24.9 (normal), you should gain 25-35 lb (11-16 kg).  Was 25-29.9 (overweight), you should gain 15-25 lb (7-11 kg).  Was 30 or greater (obese), you should gain 11-20 lb (5-9 kg). What if I am having twins or multiples? Generally, if you are carrying twins or multiples:  You may need to eat 300-600 extra calories a day.  The recommended range for total weight gain is 25-54 lb (11-25 kg), depending on your BMI before pregnancy.  Talk with your health care provider to find out about nutritional needs, weight  gain, and exercise that is right for you. What foods can I eat?  Grains All grains. Choose whole grains, such as whole-wheat bread, oatmeal, or brown rice. Vegetables All vegetables. Eat a variety of colors and types of vegetables. Remember to wash your vegetables well before peeling or eating. Fruits All fruits. Eat a variety of colors and types of fruit. Remember to wash your fruits well before peeling or eating. Meats and other protein foods Lean meats, including chicken, [place], fish, and lean cuts of beef, veal, or pork. If you eat fish or seafood, choose options that are higher in omega-3 fatty acids and lower in mercury, such as salmon, herring, mussels, trout, sardines, pollock, shrimp, crab, and lobster. Tofu. Tempeh. Beans. Eggs. Peanut butter and other nut butters. Make sure that all meats, poultry, and eggs are cooked to food-safe temperatures or "well-done." Two or more servings of fish are recommended each week in order to get the most benefits from omega-3 fatty acids that are found in seafood. Choose fish that are lower in mercury. You can find more information online:  [employer]com.ee Dairy Pasteurized milk and milk alternatives (such as almond milk). Pasteurized yogurt and pasteurized cheese. Cottage cheese. Sour cream. Beverages Water. Juices that contain 100% fruit juice or vegetable juice. Caffeine-free teas and decaffeinated coffee. Drinks that contain caffeine are okay to drink, but it is better to avoid caffeine. Keep your total caffeine intake to less than 200 mg each day (which is 12 oz or 355 mL of coffee, tea, or soda) or the limit as told by your health care provider. Fats and oils Fats and oils are okay to include in moderation. Sweets and desserts Sweets and desserts are okay to include in moderation. Seasoning and other foods All pasteurized condiments. The items listed above may not be a complete list of recommended foods and beverages. Contact your dietitian for  more options. What foods are not recommended? Vegetables Raw (unpasteurized) vegetable juices. Fruits Unpasteurized fruit juices. Meats and other protein foods Lunch meats, bologna, hot dogs, or other deli meats. (If you must eat those meats, reheat  them until they are steaming hot.) Refrigerated pat, meat spreads from a meat counter, smoked seafood that is found in the refrigerated section of a store. Raw or undercooked meats, poultry, and eggs. Raw fish, such as sushi or sashimi. Fish that have high mercury content, such as tilefish, shark, swordfish, and king mackerel. To learn more about mercury in fish, talk with your health care provider or look for online resources, such as:  [employer]com[employer]ee Dairy Raw (unpasteurized) milk and any foods that have raw milk in them. Soft cheeses, such as feta, queso blanco, queso fresco, Brie, Camembert cheeses, blue-veined cheeses, and Panela cheese (unless it is made with pasteurized milk, which must be stated on the label). Beverages Alcohol. Sugar-sweetened beverages, such as sodas, teas, or energy drinks. Seasoning and other foods Homemade fermented foods and drinks, such as pickles, sauerkraut, or kombucha drinks. (Store-bought pasteurized versions of these are okay.) Salads that are made in a store or deli, such as ham salad, chicken salad, egg salad, tuna salad, and seafood salad. The items listed above may not be a complete list of foods and beverages to avoid. Contact your dietitian for more information. Where to find more information To calculate the number of calories you need based on your height, weight, and activity level, you can use an online calculator such as:  PackageNews.is To calculate how much weight you should gain during pregnancy, you can use an online pregnancy weight gain calculator such as:  http://jones-berg.com/ Summary  While you are pregnant, your body requires additional  nutrition to help support your growing baby.  Eat a variety of foods, especially fruits and vegetables to get a full range of vitamins and minerals.  Practice good food safety and cleanliness. Wash your hands before you eat and after you prepare raw meat. Wash all fruits and vegetables well before peeling or eating. Taking these actions can help to prevent food-borne illnesses, such as listeriosis, that can be very dangerous to your baby.  Do not eat raw meat or fish. Do not eat fish that have high mercury content, such as tilefish, shark, swordfish, and king mackerel. Do not eat unpasteurized (raw) dairy.  Take a prenatal vitamin to help meet your additional vitamin and mineral needs during pregnancy, specifically for folic acid, iron, calcium, and vitamin D. This information is not intended to replace advice given to you by your health care provider. Make sure you discuss any questions you have with your health care provider. Document Released: 09/01/2014 Document Revised: 08/14/2017 Document Reviewed: 08/14/2017 Elsevier Interactive Patient Education  2019 Elsevier Inc.  Heartburn During Pregnancy  Heartburn is pain or discomfort in the throat or chest. It may cause a burning feeling. It happens when stomach acid moves up into the tube that carries food from your mouth to your stomach (esophagus). Heartburn is common during pregnancy. It usually goes away or gets better after giving birth. Follow these instructions at home: Eating and drinking  Do not drink alcohol while you are pregnant.  Figure out which foods and beverages make you feel worse, and avoid them.  Beverages that you may want to avoid include: ? Coffee and tea (with or without caffeine). ? Energy drinks and sports drinks. ? Bubbly (carbonated) drinks or sodas. ? Citrus fruit juices.  Foods that you may want to avoid include: ? Chocolate and cocoa. ? Peppermint and mint flavorings. ? Garlic, onions, and  horseradish. ? Spicy and acidic foods. These include peppers, chili powder, curry powder, vinegar, hot sauces, and  barbecue sauce. ? Citrus fruits, such as oranges, lemons, and limes. ? Tomato-based foods, such as red sauce, chili, and salsa. ? Fried and fatty foods, such as donuts, french fries, potato chips, and high-fat dressings. ? High-fat meats, such as hot dogs, cold cuts, sausage, ham, and bacon. ? High-fat dairy items, such as whole milk, butter, and cheese.  Eat small meals often, instead of large meals.  Avoid drinking a lot of liquid with your meals.  Avoid eating meals during the 2-3 hours before you go to bed.  Avoid lying down right after you eat.  Do not exercise right after you eat. Medicines  Take over-the-counter and prescription medicines only as told by your doctor.  Do not take aspirin, ibuprofen, or other NSAIDs unless your doctor tells you to do that.  Your doctor may tell you to avoid medicines that have sodium bicarbonate in them. General instructions   If told, raise the head of your bed about 6 inches (15 cm). You can do this by putting blocks under the legs. Sleeping with more pillows does not help with heartburn.  Do not use any products that contain nicotine or tobacco, such as cigarettes and e-cigarettes. If you need help quitting, ask your doctor.  Wear loose-fitting clothing.  Try to lower your stress, such as with yoga or meditation. If you need help, ask your doctor.  Stay at a healthy weight. If you are overweight, work with your doctor to safely lose weight.  Keep all follow-up visits as told by your doctor. This is important. Contact a doctor if:  You get new symptoms.  Your symptoms do not get better with treatment.  You have weight loss and you do not know why.  You have trouble swallowing.  You make loud sounds when you breathe (wheeze).  You have a cough that does not go away.  You have heartburn often for more than 2  weeks.  You feel sick to your stomach (nauseous), and this does not get better with treatment.  You are throwing up (vomiting), and this does not get better with treatment.  You have pain in your belly (abdomen). Get help right away if:  You have very bad chest pain that spreads to your arm, neck, or jaw.  You feel sweaty, dizzy, or light-headed.  You have trouble breathing.  You have pain when swallowing.  You throw up and your throw-up looks like blood or coffee grounds.  Your poop (stool) is bloody or black. This information is not intended to replace advice given to you by your health care provider. Make sure you discuss any questions you have with your health care provider. Document Released: 12/20/2010 Document Revised: 08/04/2016 Document Reviewed: 08/04/2016 Elsevier Interactive Patient Education  2019 [employer].

## 2019-02-25 NOTE — Progress Notes (Signed)
NEW OB HISTORY AND PHYSICAL  SUBJECTIVE:       [name] is a 28 y.o. G19P1001 female, Patient's last menstrual period was 11/29/2018 (exact date)., Estimated Date of Delivery: 09/05/19, [redacted]w[redacted]d, presents today for establishment of Prenatal Care.  Reports nausea without vomiting, intermittent back pain, and nipple tenderness.   Denies difficulty breathing or respiratory distress, chest pain, abdominal pain, vaginal bleeding, dysuria, and leg pain or swelling.   Declines genetic screening. Desires midwifery care.    Gynecologic History  Patient's last menstrual period was 11/29/2018 (exact date).   Contraception: none  Last Pap: 10/2017. Results were: normal  Obstetric History  OB History  Gravida Para Term Preterm AB Living  2 1 1     1   SAB TAB Ectopic Multiple Live Births          1    # Outcome Date GA Lbr Len/2nd Weight Sex Delivery Anes PTL Lv  2 Current           1 Term 05/03/17 [redacted]w[redacted]d  6 lb 11 oz (3.033 kg) F Vag-Spont   LIV    Past Medical History:  Diagnosis Date  . Anxiety   . Heartburn     Past Surgical History:  Procedure Laterality Date  . WISDOM TOOTH EXTRACTION      Current Outpatient Medications on File Prior to Visit  Medication Sig Dispense Refill  . Prenat-FeFmCb-DSS-FA-DHA w/o A (CITRANATAL HARMONY) 27-1-260 MG CAPS Take 260 mg by mouth daily. 30 capsule 11  . triamcinolone cream (KENALOG) 0.1 % Apply 1 application topically 2 (two) times daily. 30 g 0  . sertraline (ZOLOFT) 50 MG tablet Take 1 tablet (50 mg total) by mouth daily. 90 tablet 0   No current facility-administered medications on file prior to visit.     No Known Allergies  Social History   Socioeconomic History  . Marital status: Married    Spouse name: Not on file  . Number of children: Not on file  . Years of education: Not on file  . Highest education level: Not on file  Occupational History  . Not on file  Social Needs  . Financial resource strain: Not on file   . Food insecurity:    Worry: Not on file    Inability: Not on file  . Transportation needs:    Medical: Not on file    Non-medical: Not on file  Tobacco Use  . Smoking status: Former Smoker    Last attempt to quit: 11/24/2014    Years since quitting: 4.2  . Smokeless tobacco: Never Used  Substance and Sexual Activity  . Alcohol use: No  . Drug use: No  . Sexual activity: Yes    Partners: Male    Birth control/protection: None  Lifestyle  . Physical activity:    Days per week: Not on file    Minutes per session: Not on file  . Stress: Not on file  Relationships  . Social connections:    Talks on phone: Not on file    Gets together: Not on file    Attends religious service: Not on file    Active member of club or organization: Not on file    Attends meetings of clubs or organizations: Not on file    Relationship status: Not on file  . Intimate partner violence:    Fear of current or ex partner: Not on file    Emotionally abused: Not on file    Physically abused: Not on  file    Forced sexual activity: Not on file  Other Topics Concern  . Not on file  Social History Narrative  . Not on file    Family History  Problem Relation Age of Onset  . Cancer Mother        SKIN CANCER  . Hypertension Father   . Cancer Father        PROSTATE CANCER  . Seizures Brother   . Cancer Maternal Grandmother        BRAIN TUMOR  . Breast cancer Neg Hx   . Ovarian cancer Neg Hx   . Colon cancer Neg Hx     The following portions of the patient's history were reviewed and updated as appropriate: allergies, current medications, past OB history, past medical history, past surgical history, past family history, past social history, and problem list.  Review of Systems:  ROS negative except as noted above. Information obtained from patient.   OBJECTIVE:  BP 131/85   Pulse (!) 109   Wt 228 lb 8 oz (103.6 kg)   LMP 11/29/2018 (Exact Date)   BMI 38.02 kg/m   Initial Physical Exam  (New OB)  GENERAL APPEARANCE: alert, well appearing, in no apparent distress  HEAD: normocephalic, atraumatic  MOUTH: mucous membranes moist, pharynx normal without lesions  THYROID: no thyromegaly or masses present  BREASTS: no masses noted, no significant tenderness, no palpable axillary nodes, no skin changes  LUNGS: clear to auscultation, no wheezes, rales or rhonchi, symmetric air entry  HEART: regular rate and rhythm, no murmurs  ABDOMEN: soft, nontender, nondistended, no abnormal masses, no epigastric pain, obese and FHT present  EXTREMITIES: no redness or tenderness in the calves or thighs, no edema  SKIN: normal coloration and turgor, no rashes  LYMPH NODES: no adenopathy palpable  NEUROLOGIC: alert, oriented, normal speech, no focal findings or movement disorder noted  PELVIC EXAM: not examined  ASSESSMENT: Normal pregnancy Obesity in pregnancy Reflux in pregnancy RH negative  PLAN: Prenatal care Rx: Prontonix, see orders New OB counseling: The patient has been given an overview regarding routine prenatal care. Recommendations regarding diet, weight gain, and exercise in pregnancy were given. Prenatal testing, optional genetic testing, and ultrasound use in pregnancy were reviewed.  Benefits of Breast Feeding were discussed. The patient is encouraged to consider nursing her baby post partum. See orders

## 2019-02-27 ENCOUNTER — Encounter: Payer: Self-pay | Admitting: Certified Nurse Midwife

## 2019-04-05 ENCOUNTER — Other Ambulatory Visit: Payer: Medicaid Other

## 2019-04-05 ENCOUNTER — Encounter: Payer: Medicaid Other | Admitting: Certified Nurse Midwife

## 2019-04-05 ENCOUNTER — Other Ambulatory Visit: Payer: Self-pay | Admitting: Obstetrics and Gynecology

## 2019-04-05 ENCOUNTER — Telehealth: Payer: Self-pay

## 2019-04-05 DIAGNOSIS — Z3492 Encounter for supervision of normal pregnancy, unspecified, second trimester: Secondary | ICD-10-CM

## 2019-04-05 NOTE — Telephone Encounter (Signed)
Coronavirus (COVID-19) Are you at risk?  Are you at risk for the Coronavirus (COVID-19)?  To be considered HIGH RISK for Coronavirus (COVID-19), you have to meet the following criteria:  . Traveled to [place], [place], [place], [place] or [place]; or in the [place] to [place], [place], [place], or [place]; and have fever, cough, and shortness of breath within the last 2 weeks of travel OR . Been in close contact with a person diagnosed with COVID-19 within the last 2 weeks and have fever, cough, and shortness of breath . IF YOU DO NOT MEET THESE CRITERIA, YOU ARE CONSIDERED LOW RISK FOR COVID-19.  What to do if you are HIGH RISK for COVID-19?  . If you are having a medical emergency, call 911. . Seek medical care right away. Before you go to a doctor's office, urgent care or emergency department, call ahead and tell them about your recent travel, contact with someone diagnosed with COVID-19, and your symptoms. You should receive instructions from your physician's office regarding next steps of care.  . When you arrive at healthcare provider, tell the healthcare staff immediately you have returned from visiting [place], [place], [place], [place] or [place]; or traveled in the [place] to [place], [place], [place], or [place]; in the last two weeks or you have been in close contact with a person diagnosed with COVID-19 in the last 2 weeks.   . Tell the health care staff about your symptoms: fever, cough and shortness of breath. . After you have been seen by a medical provider, you will be either: o Tested for (COVID-19) and discharged home on quarantine except to seek medical care if symptoms worsen, and asked to  - Stay home and avoid contact with others until you get your results (4-5 days)  - Avoid travel on public transportation if possible (such as bus, train, or airplane) or o Sent to the Emergency Department by EMS for evaluation, COVID-19 testing, and possible  admission depending on your condition and test results.  What to do if you are LOW RISK for COVID-19?  Reduce your risk of any infection by using the same precautions used for avoiding the common cold or flu:  . Wash your hands often with soap and warm water for at least 20 seconds.  If soap and water are not readily available, use an alcohol-based hand sanitizer with at least 60% alcohol.  . If coughing or sneezing, cover your mouth and nose by coughing or sneezing into the elbow areas of your shirt or coat, into a tissue or into your sleeve (not your hands). . Avoid shaking hands with others and consider head nods or verbal greetings only. . Avoid touching your eyes, nose, or mouth with unwashed hands.  . Avoid close contact with people who are sick. . Avoid places or events with large numbers of people in one location, like concerts or sporting events. . Carefully consider travel plans you have or are making. . If you are planning any travel outside or inside the [place], visit the CDC's Travelers' Health webpage for the latest health notices. . If you have some symptoms but not all symptoms, continue to monitor at home and seek medical attention if your symptoms worsen. . If you are having a medical emergency, call 911.   ADDITIONAL HEALTHCARE OPTIONS FOR PATIENTS  [facility] / e-Visit: [email or web address]         [facility]: [phone]  [facility]: [phone]                   [facility]: [phone]   Pre-screen negative, DM.   

## 2019-04-06 ENCOUNTER — Ambulatory Visit (INDEPENDENT_AMBULATORY_CARE_PROVIDER_SITE_OTHER): Payer: Medicaid Other | Admitting: Certified Nurse Midwife

## 2019-04-06 ENCOUNTER — Ambulatory Visit (INDEPENDENT_AMBULATORY_CARE_PROVIDER_SITE_OTHER): Payer: Medicaid Other

## 2019-04-06 ENCOUNTER — Other Ambulatory Visit: Payer: Self-pay

## 2019-04-06 VITALS — BP 114/80 | HR 81 | Wt 229.6 lb

## 2019-04-06 DIAGNOSIS — Z363 Encounter for antenatal screening for malformations: Secondary | ICD-10-CM

## 2019-04-06 DIAGNOSIS — Z3482 Encounter for supervision of other normal pregnancy, second trimester: Secondary | ICD-10-CM

## 2019-04-06 DIAGNOSIS — Z3492 Encounter for supervision of normal pregnancy, unspecified, second trimester: Secondary | ICD-10-CM

## 2019-04-06 LAB — POCT URINALYSIS DIPSTICK OB
Bilirubin, UA: NEGATIVE
Blood, UA: NEGATIVE
Glucose, UA: NEGATIVE
Ketones, UA: NEGATIVE
Leukocytes, UA: NEGATIVE
Nitrite, UA: NEGATIVE
POC,PROTEIN,UA: NEGATIVE
Spec Grav, UA: 1.02 (ref 1.010–1.025)
Urobilinogen, UA: 0.2 E.U./dL
pH, UA: 5 (ref 5.0–8.0)

## 2019-04-06 NOTE — Progress Notes (Signed)
ROB doing well. Anatomy u/s today (incomplete) See below. Discussed anterior placenta. Follow up u/s 2-3 wks. ROB 6-7 wks with glucose screen. Pt asked about delivering at [facility]. States she loves midwives but did not have a good experience at [facility] with last delivery. Reviewed that we do not deliver at [facility] and if she should deliver their that it would possibly be by a resident/attending. Recommended establishing care with [facility] provider if she is going to deliver at [facility]. She verbalizes understanding. Follow up as listed above.   Doreene Burke, CNM     Patient Name: [name] DOB: 1991/01/06 MRN: [id number] ULTRASOUND REPORT  Location: [facility] Date of Service: 04/06/2019   Indications:Anatomy Ultrasound Findings:  Mason Jim intrauterine pregnancy is visualized with FHR at 150 BPM. Biometrics give an (U/S) Gestational age of [redacted]w[redacted]d and an (U/S) EDD of 09/04/2019; this correlates with the clinically established Estimated Date of Delivery: 09/05/19  Fetal presentation is Variable.  EFW: 234 g ( 8 oz). Placenta: anterior. Grade: 1 AFI: subjectively normal.  Anatomic survey is incomplete for LOFT ,ROFT, and normal; Gender - female.    Right Ovary is normal in appearance. Left Ovary is normal appearance. Survey of the adnexa demonstrates no adnexal masses. There is no free peritoneal fluid in the cul de sac.  Impression: 1. [redacted]w[redacted]d Viable Singleton Intrauterine pregnancy by U/S. 2. (U/S) EDD is consistent with Clinically established Estimated Date of Delivery: 09/05/19 . 3. Limited anatomy exam do to fetal age and body habitus.  Recommendations: 1.Clinical correlation with the patient's History and Physical Exam.   Jenine M. Marciano Sequin    RDMS

## 2019-04-06 NOTE — Patient Instructions (Signed)

## 2019-04-18 ENCOUNTER — Telehealth: Payer: Self-pay

## 2019-04-18 NOTE — Telephone Encounter (Signed)
Coronavirus (COVID-19) Are you at risk?  Are you at risk for the Coronavirus (COVID-19)?  To be considered HIGH RISK for Coronavirus (COVID-19), you have to meet the following criteria:  . Traveled to [place], [place], [place], [place] or [place]; or in the [place] to [place], [place], [place], or [place]; and have fever, cough, and shortness of breath within the last 2 weeks of travel OR . Been in close contact with a person diagnosed with COVID-19 within the last 2 weeks and have fever, cough, and shortness of breath . IF YOU DO NOT MEET THESE CRITERIA, YOU ARE CONSIDERED LOW RISK FOR COVID-19.  What to do if you are HIGH RISK for COVID-19?  . If you are having a medical emergency, call 911. . Seek medical care right away. Before you go to a doctor's office, urgent care or emergency department, call ahead and tell them about your recent travel, contact with someone diagnosed with COVID-19, and your symptoms. You should receive instructions from your physician's office regarding next steps of care.  . When you arrive at healthcare provider, tell the healthcare staff immediately you have returned from visiting [place], [place], [place], [place] or [place]; or traveled in the [place] to [place], [place], [place], or [place]; in the last two weeks or you have been in close contact with a person diagnosed with COVID-19 in the last 2 weeks.   . Tell the health care staff about your symptoms: fever, cough and shortness of breath. . After you have been seen by a medical provider, you will be either: o Tested for (COVID-19) and discharged home on quarantine except to seek medical care if symptoms worsen, and asked to  - Stay home and avoid contact with others until you get your results (4-5 days)  - Avoid travel on public transportation if possible (such as bus, train, or airplane) or o Sent to the Emergency Department by EMS for evaluation, COVID-19 testing, and possible  admission depending on your condition and test results.  What to do if you are LOW RISK for COVID-19?  Reduce your risk of any infection by using the same precautions used for avoiding the common cold or flu:  . Wash your hands often with soap and warm water for at least 20 seconds.  If soap and water are not readily available, use an alcohol-based hand sanitizer with at least 60% alcohol.  . If coughing or sneezing, cover your mouth and nose by coughing or sneezing into the elbow areas of your shirt or coat, into a tissue or into your sleeve (not your hands). . Avoid shaking hands with others and consider head nods or verbal greetings only. . Avoid touching your eyes, nose, or mouth with unwashed hands.  . Avoid close contact with people who are sick. . Avoid places or events with large numbers of people in one location, like concerts or sporting events. . Carefully consider travel plans you have or are making. . If you are planning any travel outside or inside the [place], visit the CDC's Travelers' Health webpage for the latest health notices. . If you have some symptoms but not all symptoms, continue to monitor at home and seek medical attention if your symptoms worsen. . If you are having a medical emergency, call 911.   ADDITIONAL HEALTHCARE OPTIONS FOR PATIENTS  [facility] / e-Visit: [email or web address]         [facility]: [phone]  [facility]: [phone]                   [facility]: [phone]   Prescreened. Neg .cm 

## 2019-04-19 ENCOUNTER — Other Ambulatory Visit: Payer: Self-pay

## 2019-04-19 ENCOUNTER — Ambulatory Visit (INDEPENDENT_AMBULATORY_CARE_PROVIDER_SITE_OTHER): Payer: Medicaid Other

## 2019-04-19 DIAGNOSIS — Z362 Encounter for other antenatal screening follow-up: Secondary | ICD-10-CM

## 2019-04-19 DIAGNOSIS — Z3482 Encounter for supervision of other normal pregnancy, second trimester: Secondary | ICD-10-CM

## 2019-05-23 ENCOUNTER — Telehealth: Payer: Self-pay

## 2019-05-23 NOTE — Telephone Encounter (Signed)
Coronavirus (COVID-19) Are you at risk?  Are you at risk for the Coronavirus (COVID-19)?  To be considered HIGH RISK for Coronavirus (COVID-19), you have to meet the following criteria:  . Traveled to [place], [place], [place], [place] or [place]; or in the [place] to [place], [place], [place], or [place]; and have fever, cough, and shortness of breath within the last 2 weeks of travel OR . Been in close contact with a person diagnosed with COVID-19 within the last 2 weeks and have fever, cough, and shortness of breath . IF YOU DO NOT MEET THESE CRITERIA, YOU ARE CONSIDERED LOW RISK FOR COVID-19.  What to do if you are HIGH RISK for COVID-19?  . If you are having a medical emergency, call 911. . Seek medical care right away. Before you go to a doctor's office, urgent care or emergency department, call ahead and tell them about your recent travel, contact with someone diagnosed with COVID-19, and your symptoms. You should receive instructions from your physician's office regarding next steps of care.  . When you arrive at healthcare provider, tell the healthcare staff immediately you have returned from visiting [place], [place], [place], [place] or [place]; or traveled in the [place] to [place], [place], [place], or [place]; in the last two weeks or you have been in close contact with a person diagnosed with COVID-19 in the last 2 weeks.   . Tell the health care staff about your symptoms: fever, cough and shortness of breath. . After you have been seen by a medical provider, you will be either: o Tested for (COVID-19) and discharged home on quarantine except to seek medical care if symptoms worsen, and asked to  - Stay home and avoid contact with others until you get your results (4-5 days)  - Avoid travel on public transportation if possible (such as bus, train, or airplane) or o Sent to the Emergency Department by EMS for evaluation, COVID-19 testing, and possible  admission depending on your condition and test results.  What to do if you are LOW RISK for COVID-19?  Reduce your risk of any infection by using the same precautions used for avoiding the common cold or flu:  . Wash your hands often with soap and warm water for at least 20 seconds.  If soap and water are not readily available, use an alcohol-based hand sanitizer with at least 60% alcohol.  . If coughing or sneezing, cover your mouth and nose by coughing or sneezing into the elbow areas of your shirt or coat, into a tissue or into your sleeve (not your hands). . Avoid shaking hands with others and consider head nods or verbal greetings only. . Avoid touching your eyes, nose, or mouth with unwashed hands.  . Avoid close contact with people who are [name]. . Avoid places or events with large numbers of people in one location, like concerts or sporting events. . Carefully consider travel plans you have or are making. . If you are planning any travel outside or inside the [place], visit the CDC's Travelers' Health webpage for the latest health notices. . If you have some symptoms but not all symptoms, continue to monitor at home and seek medical attention if your symptoms worsen. . If you are having a medical emergency, call 911.  05/23/19 SCREENING NEG SLS ADDITIONAL HEALTHCARE OPTIONS FOR PATIENTS  [facility] / e-Visit: [email or web address]         [facility]: [phone]    [facility]: [phone]                   [facility]: [phone]  

## 2019-05-24 ENCOUNTER — Other Ambulatory Visit: Payer: Self-pay

## 2019-05-24 ENCOUNTER — Other Ambulatory Visit: Payer: Medicaid Other

## 2019-05-24 ENCOUNTER — Ambulatory Visit (INDEPENDENT_AMBULATORY_CARE_PROVIDER_SITE_OTHER): Payer: Medicaid Other | Admitting: Obstetrics and Gynecology

## 2019-05-24 VITALS — BP 126/73 | HR 75 | Wt 233.7 lb

## 2019-05-24 DIAGNOSIS — Z3482 Encounter for supervision of other normal pregnancy, second trimester: Secondary | ICD-10-CM | POA: Diagnosis not present

## 2019-05-24 DIAGNOSIS — Z3492 Encounter for supervision of normal pregnancy, unspecified, second trimester: Secondary | ICD-10-CM

## 2019-05-24 NOTE — Progress Notes (Signed)
ROB- pt did her glucola today

## 2019-05-24 NOTE — Progress Notes (Signed)
ROB and glucola- was supposed to have already done early glucola- but it was missed due to rescheduling with pandemic. Working from home, lives with spouse and daughter.

## 2019-05-25 LAB — GLUCOSE TOLERANCE, 1 HOUR: Glucose, 1Hr PP: 131 mg/dL (ref 65–199)

## 2019-06-16 ENCOUNTER — Ambulatory Visit (INDEPENDENT_AMBULATORY_CARE_PROVIDER_SITE_OTHER): Payer: Medicaid Other | Admitting: Certified Nurse Midwife

## 2019-06-16 ENCOUNTER — Other Ambulatory Visit: Payer: Self-pay

## 2019-06-16 VITALS — BP 113/79 | HR 80 | Wt 231.4 lb

## 2019-06-16 DIAGNOSIS — Z3493 Encounter for supervision of normal pregnancy, unspecified, third trimester: Secondary | ICD-10-CM

## 2019-06-16 DIAGNOSIS — O26893 Other specified pregnancy related conditions, third trimester: Secondary | ICD-10-CM | POA: Diagnosis not present

## 2019-06-16 DIAGNOSIS — Z6791 Unspecified blood type, Rh negative: Secondary | ICD-10-CM | POA: Diagnosis not present

## 2019-06-16 DIAGNOSIS — Z23 Encounter for immunization: Secondary | ICD-10-CM

## 2019-06-16 MED ORDER — RHO D IMMUNE GLOBULIN 1500 UNIT/2ML IJ SOSY
300.0000 ug | PREFILLED_SYRINGE | Freq: Once | INTRAMUSCULAR | Status: AC
  Administered 2019-06-16: 300 ug via INTRAMUSCULAR

## 2019-06-16 MED ORDER — TETANUS-DIPHTH-ACELL PERTUSSIS 5-2.5-18.5 LF-MCG/0.5 IM SUSP
0.5000 mL | Freq: Once | INTRAMUSCULAR | Status: AC
  Administered 2019-06-16: 11:00:00 0.5 mL via INTRAMUSCULAR

## 2019-06-16 NOTE — Progress Notes (Signed)
ROB-Doing well, no concerns. TDaP/Rhogam given today, see chart. Blood transfusion consent reviewed and signed. Using [employer]. Extensive discussion regarding transfer of care to [facility] if that is her desired birth facility. Offered anesthesia consult at [facility] for history of inadequate pain relief during last labor. Illustrated [employer] Choices and [employer] provided. Anticipatory guidance regarding course of prenatal. Reviewed red flag symptoms and when to call. RTC x 2 weeks for ROB or labs or sooner if needed.

## 2019-06-16 NOTE — Progress Notes (Signed)
ROB-No complaints.  

## 2019-06-16 NOTE — Patient Instructions (Signed)

## 2019-06-24 ENCOUNTER — Encounter: Payer: Self-pay | Admitting: Certified Nurse Midwife

## 2019-06-27 ENCOUNTER — Telehealth: Payer: Self-pay | Admitting: *Deleted

## 2019-06-27 NOTE — Anesthesia Pain Management Evaluation Note (Signed)
SPOKE WITH PATIENT AT REQUEST OF [facility]. PATEINT STATES BAD EXPERIENCE WITH FIRST DELIVERY 05/03/2017. STATES EPIDURAL WORE OFF X 2 AND HAD TO DELIVER WITHOUT PAIN CONTROL FROM EPIDURAL. SECOND EPIDURAL DOSE ONLY LASTED 2 HOURS. ANXIOUS ABOUT UPCOMING DELIVERY IN October. MESSAGE SENT TO MICHEL[facility]WH[facility].

## 2019-07-01 ENCOUNTER — Encounter: Payer: Self-pay | Admitting: Certified Nurse Midwife

## 2019-07-01 ENCOUNTER — Other Ambulatory Visit: Payer: Self-pay

## 2019-07-01 ENCOUNTER — Ambulatory Visit (INDEPENDENT_AMBULATORY_CARE_PROVIDER_SITE_OTHER): Payer: Medicaid Other | Admitting: Certified Nurse Midwife

## 2019-07-01 VITALS — BP 117/70 | HR 76 | Wt 230.5 lb

## 2019-07-01 DIAGNOSIS — Z3493 Encounter for supervision of normal pregnancy, unspecified, third trimester: Secondary | ICD-10-CM

## 2019-07-01 NOTE — Progress Notes (Signed)
ROB doing well. Feels good movement. Denies concerns. Follow up 2 wks.   Philip Aspen, CNM

## 2019-07-01 NOTE — Progress Notes (Signed)
ROB-No complaints.  

## 2019-07-01 NOTE — Patient Instructions (Signed)
Braxton Hicks Contractions Contractions of the uterus can occur throughout pregnancy, but they are not always a sign that you are in labor. You may have practice contractions called Braxton Hicks contractions. These false labor contractions are sometimes confused with true labor. What are Braxton Hicks contractions? Braxton Hicks contractions are tightening movements that occur in the muscles of the uterus before labor. Unlike true labor contractions, these contractions do not result in opening (dilation) and thinning of the cervix. Toward the end of pregnancy (32-34 weeks), Braxton Hicks contractions can happen more often and may become stronger. These contractions are sometimes difficult to tell apart from true labor because they can be very uncomfortable. You should not feel embarrassed if you go to the hospital with false labor. Sometimes, the only way to tell if you are in true labor is for your health care provider to look for changes in the cervix. The health care provider will do a physical exam and may monitor your contractions. If you are not in true labor, the exam should show that your cervix is not dilating and your water has not broken. If there are no other health problems associated with your pregnancy, it is completely safe for you to be sent home with false labor. You may continue to have Braxton Hicks contractions until you go into true labor. How to tell the difference between true labor and false labor True labor  Contractions last 30-70 seconds.  Contractions become very regular.  Discomfort is usually felt in the top of the uterus, and it spreads to the lower abdomen and low back.  Contractions do not go away with walking.  Contractions usually become more intense and increase in frequency.  The cervix dilates and gets thinner. False labor  Contractions are usually shorter and not as strong as true labor contractions.  Contractions are usually irregular.  Contractions  are often felt in the front of the lower abdomen and in the groin.  Contractions may go away when you walk around or change positions while lying down.  Contractions get weaker and are shorter-lasting as time goes on.  The cervix usually does not dilate or become thin. Follow these instructions at home:   Take over-the-counter and prescription medicines only as told by your health care provider.  Keep up with your usual exercises and follow other instructions from your health care provider.  Eat and drink lightly if you think you are going into labor.  If Braxton Hicks contractions are making you uncomfortable: ? Change your position from lying down or resting to walking, or change from walking to resting. ? Sit and rest in a tub of warm water. ? Drink enough fluid to keep your urine pale yellow. Dehydration may cause these contractions. ? Do slow and deep breathing several times an hour.  Keep all follow-up prenatal visits as told by your health care provider. This is important. Contact a health care provider if:  You have a fever.  You have continuous pain in your abdomen. Get help right away if:  Your contractions become stronger, more regular, and closer together.  You have fluid leaking or gushing from your vagina.  You pass blood-tinged mucus (bloody show).  You have bleeding from your vagina.  You have low back pain that you never had before.  You feel your baby's head pushing down and causing pelvic pressure.  Your baby is not moving inside you as much as it used to. Summary  Contractions that occur before labor are   called Braxton Hicks contractions, false labor, or practice contractions.  Braxton Hicks contractions are usually shorter, weaker, farther apart, and less regular than true labor contractions. True labor contractions usually become progressively stronger and regular, and they become more frequent.  Manage discomfort from Braxton Hicks contractions  by changing position, resting in a warm bath, drinking plenty of water, or practicing deep breathing. This information is not intended to replace advice given to you by your health care provider. Make sure you discuss any questions you have with your health care provider. Document Released: 04/02/2017 Document Revised: 10/30/2017 Document Reviewed: 04/02/2017 Elsevier Patient Education  2020 Elsevier Inc.  
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
## 2019-07-12 ENCOUNTER — Other Ambulatory Visit: Payer: Self-pay

## 2019-07-12 ENCOUNTER — Encounter
Admission: RE | Admit: 2019-07-12 | Discharge: 2019-07-12 | Disposition: A | Discharge status: Home / Self Care | Payer: Medicaid Other | Source: Ambulatory Visit | Attending: Anesthesiology | Admitting: Anesthesiology
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
## 2019-07-14 ENCOUNTER — Ambulatory Visit (INDEPENDENT_AMBULATORY_CARE_PROVIDER_SITE_OTHER): Payer: Medicaid Other | Admitting: Obstetrics and Gynecology

## 2019-07-14 ENCOUNTER — Other Ambulatory Visit: Payer: Self-pay

## 2019-07-14 VITALS — BP 119/74 | HR 91 | Wt 229.8 lb

## 2019-07-14 DIAGNOSIS — Z3493 Encounter for supervision of normal pregnancy, unspecified, third trimester: Secondary | ICD-10-CM

## 2019-07-14 DIAGNOSIS — B379 Candidiasis, unspecified: Secondary | ICD-10-CM

## 2019-07-14 MED ORDER — FLUCONAZOLE 150 MG PO TABS: 150.0000 mg | ORAL_TABLET | Freq: Once | ORAL | 0 refills | Status: AC

## 2019-07-14 NOTE — Progress Notes (Signed)
ROB- pt is doing well, states for last 2 days having some back pain

## 2019-07-14 NOTE — Progress Notes (Signed)
ROB presents with concerns for Irregular mole on left breast, Vaginal itching with intermittent pink d/c, sometimes post-coital, sometimes not, and history of aging placenta hx - desires [place] to eval for this pregnancy.   Discussed birth plans for epidural, perineal tear vs episiotomy, pain with vaginal penetration. Encouraged lubricant, toys, and other forms of pleasure. Pt plans to BF, epidural, SO will be single support person in labor. Childcare arrangements in place for labor. Anesthesia consult last week went well, anesthesia felt that her long induction with pitocin and epidural wearing off caused her to not have adequate coverage. They do not anticipate that recurring.   Yeast seen on wet prep  Diflucan Rx sent to pharmacy  Silvestre Mesi, SNM

## 2019-07-14 NOTE — Progress Notes (Signed)
Note created in error.

## 2019-07-27 ENCOUNTER — Telehealth: Payer: Self-pay

## 2019-07-27 NOTE — Telephone Encounter (Signed)
Coronavirus (COVID-19) Are you at risk?  Are you at risk for the Coronavirus (COVID-19)?  To be considered HIGH RISK for Coronavirus (COVID-19), you have to meet the following criteria:  . Traveled to [place], [place], [place], [place] or [place]; or in the [place] to [place], [place], [place], or [place]; and have fever, cough, and shortness of breath within the last 2 weeks of travel OR . Been in close contact with a person diagnosed with COVID-19 within the last 2 weeks and have fever, cough, and shortness of breath . IF YOU DO NOT MEET THESE CRITERIA, YOU ARE CONSIDERED LOW RISK FOR COVID-19.  What to do if you are HIGH RISK for COVID-19?  . If you are having a medical emergency, call 911. . Seek medical care right away. Before you go to a doctor's office, urgent care or emergency department, call ahead and tell them about your recent travel, contact with someone diagnosed with COVID-19, and your symptoms. You should receive instructions from your physician's office regarding next steps of care.  . When you arrive at healthcare provider, tell the healthcare staff immediately you have returned from visiting [place], [place], [place], [place] or [place]; or traveled in the [place] to [place], [place], [place], or [place]; in the last two weeks or you have been in close contact with a person diagnosed with COVID-19 in the last 2 weeks.   . Tell the health care staff about your symptoms: fever, cough and shortness of breath. . After you have been seen by a medical provider, you will be either: o Tested for (COVID-19) and discharged home on quarantine except to seek medical care if symptoms worsen, and asked to  - Stay home and avoid contact with others until you get your results (4-5 days)  - Avoid travel on public transportation if possible (such as bus, train, or airplane) or o Sent to the Emergency Department by EMS for evaluation, COVID-19 testing, and possible  admission depending on your condition and test results.  What to do if you are LOW RISK for COVID-19?  Reduce your risk of any infection by using the same precautions used for avoiding the common cold or flu:  . Wash your hands often with soap and warm water for at least 20 seconds.  If soap and water are not readily available, use an alcohol-based hand sanitizer with at least 60% alcohol.  . If coughing or sneezing, cover your mouth and nose by coughing or sneezing into the elbow areas of your shirt or coat, into a tissue or into your sleeve (not your hands). . Avoid shaking hands with others and consider head nods or verbal greetings only. . Avoid touching your eyes, nose, or mouth with unwashed hands.  . Avoid close contact with people who are [name]. . Avoid places or events with large numbers of people in one location, like concerts or sporting events. . Carefully consider travel plans you have or are making. . If you are planning any travel outside or inside the [place], visit the CDC's Travelers' Health webpage for the latest health notices. . If you have some symptoms but not all symptoms, continue to monitor at home and seek medical attention if your symptoms worsen. . If you are having a medical emergency, call 911.  07/27/19 SCREENING NEG SLS ADDITIONAL HEALTHCARE OPTIONS FOR PATIENTS  [facility] / e-Visit: [email or web address]         [facility]: [phone]    [facility]: [phone]                   [facility]: [phone]  

## 2019-07-28 ENCOUNTER — Other Ambulatory Visit: Payer: Self-pay

## 2019-07-28 ENCOUNTER — Other Ambulatory Visit (HOSPITAL_COMMUNITY)
Admission: RE | Admit: 2019-07-28 | Discharge: 2019-07-28 | Disposition: A | Discharge status: Home / Self Care | Payer: Medicaid Other | Source: Ambulatory Visit | Attending: Certified Nurse Midwife | Admitting: Certified Nurse Midwife

## 2019-07-28 ENCOUNTER — Ambulatory Visit (INDEPENDENT_AMBULATORY_CARE_PROVIDER_SITE_OTHER): Payer: Medicaid Other | Admitting: Certified Nurse Midwife

## 2019-07-28 VITALS — BP 111/74 | HR 91 | Wt 230.0 lb

## 2019-07-28 DIAGNOSIS — Z3A34 34 weeks gestation of pregnancy: Secondary | ICD-10-CM

## 2019-07-28 DIAGNOSIS — Z3493 Encounter for supervision of normal pregnancy, unspecified, third trimester: Secondary | ICD-10-CM | POA: Insufficient documentation

## 2019-07-28 DIAGNOSIS — N898 Other specified noninflammatory disorders of vagina: Secondary | ICD-10-CM | POA: Insufficient documentation

## 2019-07-28 DIAGNOSIS — O26893 Other specified pregnancy related conditions, third trimester: Secondary | ICD-10-CM | POA: Diagnosis not present

## 2019-07-28 LAB — POCT URINALYSIS DIPSTICK OB
Bilirubin, UA: NEGATIVE
Blood, UA: NEGATIVE
Glucose, UA: NEGATIVE
Ketones, UA: NEGATIVE
Nitrite, UA: NEGATIVE
Spec Grav, UA: 1.02 (ref 1.010–1.025)
Urobilinogen, UA: 0.2 E.U./dL
pH, UA: 6 (ref 5.0–8.0)

## 2019-07-28 NOTE — Progress Notes (Signed)
ROB-Patient c/o vaginal itching x2 days and continued vaginal d/c "for a while", took Diflucan once about 2 weeks ago with some relief.

## 2019-07-28 NOTE — Progress Notes (Signed)
ROB-Reports vaginal itching x 2 days and vaginal discharge for "a while". Notes mild relief with Diflucan taken two (2) weeks ago. Vaginal swab collected, see orders. Anticipatory guidance regarding course of prenatal care. Reviewed red flag symptoms and when to call. RTC x 2 weeks for growth ultrasound (history of premature placental aging) and ROB or sooner if needed.

## 2019-07-28 NOTE — Patient Instructions (Signed)

## 2019-08-03 ENCOUNTER — Encounter: Payer: Self-pay | Admitting: Certified Nurse Midwife

## 2019-08-03 LAB — CERVICOVAGINAL ANCILLARY ONLY
Bacterial vaginitis: NEGATIVE
Candida vaginitis: NEGATIVE
Trichomonas: NEGATIVE

## 2019-08-10 ENCOUNTER — Other Ambulatory Visit: Payer: Medicaid Other

## 2019-08-10 ENCOUNTER — Encounter: Payer: Medicaid Other | Admitting: Certified Nurse Midwife

## 2019-08-16 ENCOUNTER — Encounter: Payer: Self-pay | Admitting: Certified Nurse Midwife

## 2019-08-16 ENCOUNTER — Ambulatory Visit (INDEPENDENT_AMBULATORY_CARE_PROVIDER_SITE_OTHER): Payer: Medicaid Other

## 2019-08-16 ENCOUNTER — Other Ambulatory Visit: Payer: Self-pay

## 2019-08-16 ENCOUNTER — Encounter: Payer: Medicaid Other | Admitting: Certified Nurse Midwife

## 2019-08-16 ENCOUNTER — Other Ambulatory Visit: Payer: Medicaid Other

## 2019-08-16 ENCOUNTER — Ambulatory Visit (INDEPENDENT_AMBULATORY_CARE_PROVIDER_SITE_OTHER): Payer: Medicaid Other | Admitting: Certified Nurse Midwife

## 2019-08-16 VITALS — BP 117/79 | HR 70 | Wt 228.2 lb

## 2019-08-16 DIAGNOSIS — Z3493 Encounter for supervision of normal pregnancy, unspecified, third trimester: Secondary | ICD-10-CM

## 2019-08-16 DIAGNOSIS — Z3A37 37 weeks gestation of pregnancy: Secondary | ICD-10-CM | POA: Diagnosis not present

## 2019-08-16 LAB — POCT URINALYSIS DIPSTICK OB
Bilirubin, UA: NEGATIVE
Blood, UA: NEGATIVE
Glucose, UA: NEGATIVE
Ketones, UA: NEGATIVE
Leukocytes, UA: NEGATIVE
Nitrite, UA: NEGATIVE
POC,PROTEIN,UA: NEGATIVE
Spec Grav, UA: 1.01 (ref 1.010–1.025)
Urobilinogen, UA: 0.2 E.U./dL
pH, UA: 5 (ref 5.0–8.0)

## 2019-08-16 NOTE — Progress Notes (Signed)
ROB doing well. Follow up u/s for growth. Results reviewed with pt. Discussed labor precautions. Follow up 1 wk.   Philip Aspen, CNM   Patient Name: [name] DOB: 09-Mar-1991 MRN: [id number] ULTRASOUND REPORT  Location: [facility] Date of Service: 08/16/2019   Indications:growth/afi Findings:  [name] intrauterine pregnancy is visualized with FHR at 174 BPM. Biometrics give an (U/S) Gestational age of [redacted]w[redacted]d and an (U/S) EDD of  09/10/2019 this correlates with the clinically established Estimated Date of Delivery: 09/05/19.  Fetal presentation is Cephalic.  Placenta: anterior. Grade: 2 AFI: 9.8 cm  Growth percentile is 37. EFW: 2668 g ( 5 lb 14 oz)  Impression: 1. [redacted]w[redacted]d Viable [place] Intrauterine pregnancy previously established criteria. 2. Growth is 37%ile.  AFI is 9.8 cm.   Recommendations: 1.Clinical correlation with the patient's History and Physical Exam.   Jenine  M. Albertine Grates     RDMS

## 2019-08-16 NOTE — Patient Instructions (Signed)
Braxton Hicks Contractions Contractions of the uterus can occur throughout pregnancy, but they are not always a sign that you are in labor. You may have practice contractions called Braxton Hicks contractions. These false labor contractions are sometimes confused with true labor. What are Braxton Hicks contractions? Braxton Hicks contractions are tightening movements that occur in the muscles of the uterus before labor. Unlike true labor contractions, these contractions do not result in opening (dilation) and thinning of the cervix. Toward the end of pregnancy (32-34 weeks), Braxton Hicks contractions can happen more often and may become stronger. These contractions are sometimes difficult to tell apart from true labor because they can be very uncomfortable. You should not feel embarrassed if you go to the hospital with false labor. Sometimes, the only way to tell if you are in true labor is for your health care provider to look for changes in the cervix. The health care provider will do a physical exam and may monitor your contractions. If you are not in true labor, the exam should show that your cervix is not dilating and your water has not broken. If there are no other health problems associated with your pregnancy, it is completely safe for you to be sent home with false labor. You may continue to have Braxton Hicks contractions until you go into true labor. How to tell the difference between true labor and false labor True labor  Contractions last 30-70 seconds.  Contractions become very regular.  Discomfort is usually felt in the top of the uterus, and it spreads to the lower abdomen and low back.  Contractions do not go away with walking.  Contractions usually become more intense and increase in frequency.  The cervix dilates and gets thinner. False labor  Contractions are usually shorter and not as strong as true labor contractions.  Contractions are usually irregular.  Contractions  are often felt in the front of the lower abdomen and in the groin.  Contractions may go away when you walk around or change positions while lying down.  Contractions get weaker and are shorter-lasting as time goes on.  The cervix usually does not dilate or become thin. Follow these instructions at home:   Take over-the-counter and prescription medicines only as told by your health care provider.  Keep up with your usual exercises and follow other instructions from your health care provider.  Eat and drink lightly if you think you are going into labor.  If Braxton Hicks contractions are making you uncomfortable: ? Change your position from lying down or resting to walking, or change from walking to resting. ? Sit and rest in a tub of warm water. ? Drink enough fluid to keep your urine pale yellow. Dehydration may cause these contractions. ? Do slow and deep breathing several times an hour.  Keep all follow-up prenatal visits as told by your health care provider. This is important. Contact a health care provider if:  You have a fever.  You have continuous pain in your abdomen. Get help right away if:  Your contractions become stronger, more regular, and closer together.  You have fluid leaking or gushing from your vagina.  You pass blood-tinged mucus (bloody show).  You have bleeding from your vagina.  You have low back pain that you never had before.  You feel your baby's head pushing down and causing pelvic pressure.  Your baby is not moving inside you as much as it used to. Summary  Contractions that occur before labor are   called Braxton Hicks contractions, false labor, or practice contractions.  Braxton Hicks contractions are usually shorter, weaker, farther apart, and less regular than true labor contractions. True labor contractions usually become progressively stronger and regular, and they become more frequent.  Manage discomfort from Braxton Hicks contractions  by changing position, resting in a warm bath, drinking plenty of water, or practicing deep breathing. This information is not intended to replace advice given to you by your health care provider. Make sure you discuss any questions you have with your health care provider. Document Released: 04/02/2017 Document Revised: 10/30/2017 Document Reviewed: 04/02/2017 Elsevier Patient Education  2020 Elsevier Inc.  

## 2019-08-25 ENCOUNTER — Ambulatory Visit (INDEPENDENT_AMBULATORY_CARE_PROVIDER_SITE_OTHER): Payer: Medicaid Other | Admitting: Certified Nurse Midwife

## 2019-08-25 ENCOUNTER — Other Ambulatory Visit: Payer: Self-pay

## 2019-08-25 VITALS — BP 129/84 | HR 89 | Wt 227.4 lb

## 2019-08-25 DIAGNOSIS — Z3685 Encounter for antenatal screening for Streptococcus B: Secondary | ICD-10-CM

## 2019-08-25 DIAGNOSIS — Z3493 Encounter for supervision of normal pregnancy, unspecified, third trimester: Secondary | ICD-10-CM | POA: Diagnosis not present

## 2019-08-25 DIAGNOSIS — Z113 Encounter for screening for infections with a predominantly sexual mode of transmission: Secondary | ICD-10-CM

## 2019-08-25 LAB — POCT URINALYSIS DIPSTICK OB
Bilirubin, UA: NEGATIVE
Blood, UA: NEGATIVE
Glucose, UA: NEGATIVE
Ketones, UA: NEGATIVE
Nitrite, UA: NEGATIVE
Spec Grav, UA: 1.02 (ref 1.010–1.025)
Urobilinogen, UA: 0.2 E.U./dL
pH, UA: 6 (ref 5.0–8.0)

## 2019-08-25 LAB — OB RESULTS CONSOLE GC/CHLAMYDIA: Gonorrhea: NEGATIVE

## 2019-08-25 NOTE — Progress Notes (Signed)
ROB-Reports single episode sharp pain last night while cleaning kitchen and itchy skin. Discussed home treatment measures. Cultures collected, see orders. 36 weeks handouts provided. Reviewed red flag symptoms, signs of labor, and when to call. RTC x 1 week for ROB or sooner if needed.

## 2019-08-25 NOTE — Patient Instructions (Signed)

## 2019-08-25 NOTE — Addendum Note (Signed)
Addended by: Garner Nash on: 08/25/2019 02:57 PM   Modules accepted: Orders

## 2019-08-25 NOTE — Progress Notes (Signed)
ROB-Patient c/o sharp pelvic pain that lasted 15 mins last night while cleaning, rested and used heating pad with relief.

## 2019-08-27 LAB — STREP GP B NAA: Strep Gp B NAA: POSITIVE — AB

## 2019-08-29 ENCOUNTER — Encounter: Payer: Self-pay | Admitting: Certified Nurse Midwife

## 2019-08-29 DIAGNOSIS — B951 Streptococcus, group B, as the cause of diseases classified elsewhere: Secondary | ICD-10-CM | POA: Insufficient documentation

## 2019-08-30 LAB — GC/CHLAMYDIA PROBE AMP
Chlamydia trachomatis, NAA: NEGATIVE
Neisseria Gonorrhoeae by PCR: NEGATIVE

## 2019-09-02 ENCOUNTER — Inpatient Hospital Stay
Admission: EM | Admit: 2019-09-02 | Discharge: 2019-09-04 | DRG: 807 | Disposition: A | Discharge status: Home / Self Care | Payer: Medicaid Other | Attending: Obstetrics and Gynecology | Admitting: Obstetrics and Gynecology

## 2019-09-02 ENCOUNTER — Other Ambulatory Visit: Payer: Self-pay

## 2019-09-02 ENCOUNTER — Ambulatory Visit (INDEPENDENT_AMBULATORY_CARE_PROVIDER_SITE_OTHER): Payer: Medicaid Other | Admitting: Certified Nurse Midwife

## 2019-09-02 VITALS — BP 141/88 | HR 98 | Wt 228.6 lb

## 2019-09-02 DIAGNOSIS — O99824 Streptococcus B carrier state complicating childbirth: Secondary | ICD-10-CM | POA: Diagnosis present

## 2019-09-02 DIAGNOSIS — Z3493 Encounter for supervision of normal pregnancy, unspecified, third trimester: Secondary | ICD-10-CM

## 2019-09-02 DIAGNOSIS — Z3A39 39 weeks gestation of pregnancy: Secondary | ICD-10-CM

## 2019-09-02 DIAGNOSIS — O99214 Obesity complicating childbirth: Secondary | ICD-10-CM | POA: Diagnosis present

## 2019-09-02 DIAGNOSIS — Z87891 Personal history of nicotine dependence: Secondary | ICD-10-CM

## 2019-09-02 DIAGNOSIS — Z20828 Contact with and (suspected) exposure to other viral communicable diseases: Secondary | ICD-10-CM | POA: Diagnosis present

## 2019-09-02 NOTE — Progress Notes (Signed)
ROB doing well. Feels good movement. Labor precautions reviewed. SVE per pt request 4/60/-2 . Follow up 1 wk for growth u/s & AFI and ROB with Melody .   Philip Aspen, CNM

## 2019-09-02 NOTE — Patient Instructions (Signed)
Braxton Hicks Contractions Contractions of the uterus can occur throughout pregnancy, but they are not always a sign that you are in labor. You may have practice contractions called Braxton Hicks contractions. These false labor contractions are sometimes confused with true labor. What are Braxton Hicks contractions? Braxton Hicks contractions are tightening movements that occur in the muscles of the uterus before labor. Unlike true labor contractions, these contractions do not result in opening (dilation) and thinning of the cervix. Toward the end of pregnancy (32-34 weeks), Braxton Hicks contractions can happen more often and may become stronger. These contractions are sometimes difficult to tell apart from true labor because they can be very uncomfortable. You should not feel embarrassed if you go to the hospital with false labor. Sometimes, the only way to tell if you are in true labor is for your health care provider to look for changes in the cervix. The health care provider will do a physical exam and may monitor your contractions. If you are not in true labor, the exam should show that your cervix is not dilating and your water has not broken. If there are no other health problems associated with your pregnancy, it is completely safe for you to be sent home with false labor. You may continue to have Braxton Hicks contractions until you go into true labor. How to tell the difference between true labor and false labor True labor  Contractions last 30-70 seconds.  Contractions become very regular.  Discomfort is usually felt in the top of the uterus, and it spreads to the lower abdomen and low back.  Contractions do not go away with walking.  Contractions usually become more intense and increase in frequency.  The cervix dilates and gets thinner. False labor  Contractions are usually shorter and not as strong as true labor contractions.  Contractions are usually irregular.  Contractions  are often felt in the front of the lower abdomen and in the groin.  Contractions may go away when you walk around or change positions while lying down.  Contractions get weaker and are shorter-lasting as time goes on.  The cervix usually does not dilate or become thin. Follow these instructions at home:   Take over-the-counter and prescription medicines only as told by your health care provider.  Keep up with your usual exercises and follow other instructions from your health care provider.  Eat and drink lightly if you think you are going into labor.  If Braxton Hicks contractions are making you uncomfortable: ? Change your position from lying down or resting to walking, or change from walking to resting. ? Sit and rest in a tub of warm water. ? Drink enough fluid to keep your urine pale yellow. Dehydration may cause these contractions. ? Do slow and deep breathing several times an hour.  Keep all follow-up prenatal visits as told by your health care provider. This is important. Contact a health care provider if:  You have a fever.  You have continuous pain in your abdomen. Get help right away if:  Your contractions become stronger, more regular, and closer together.  You have fluid leaking or gushing from your vagina.  You pass blood-tinged mucus (bloody show).  You have bleeding from your vagina.  You have low back pain that you never had before.  You feel your baby's head pushing down and causing pelvic pressure.  Your baby is not moving inside you as much as it used to. Summary  Contractions that occur before labor are   called Braxton Hicks contractions, false labor, or practice contractions.  Braxton Hicks contractions are usually shorter, weaker, farther apart, and less regular than true labor contractions. True labor contractions usually become progressively stronger and regular, and they become more frequent.  Manage discomfort from Braxton Hicks contractions  by changing position, resting in a warm bath, drinking plenty of water, or practicing deep breathing. This information is not intended to replace advice given to you by your health care provider. Make sure you discuss any questions you have with your health care provider. Document Released: 04/02/2017 Document Revised: 10/30/2017 Document Reviewed: 04/02/2017 Elsevier Patient Education  2020 Elsevier Inc.  

## 2019-09-02 NOTE — OB Triage Note (Signed)
Patient came in for observation for labor evaluation. Patient reports contractions every two to five minutes since 1700. Patient rates pain 7/10. Patient denies leaking of fluid and denies vaginal bleeding and spotting.  Patient reports +FM. Vital signs stable and patient afebrile. FHR baseline 130 with moderate variability with accelerations 15 x 15 and no decelerations. Husband at bedside. Will continue to monitor.

## 2019-09-03 ENCOUNTER — Encounter: Payer: Self-pay | Admitting: Anesthesiology

## 2019-09-03 ENCOUNTER — Inpatient Hospital Stay: Payer: Medicaid Other | Admitting: Anesthesiology

## 2019-09-03 DIAGNOSIS — Z20828 Contact with and (suspected) exposure to other viral communicable diseases: Secondary | ICD-10-CM | POA: Diagnosis present

## 2019-09-03 DIAGNOSIS — O99824 Streptococcus B carrier state complicating childbirth: Secondary | ICD-10-CM | POA: Diagnosis not present

## 2019-09-03 DIAGNOSIS — Z3A39 39 weeks gestation of pregnancy: Secondary | ICD-10-CM

## 2019-09-03 DIAGNOSIS — O99214 Obesity complicating childbirth: Secondary | ICD-10-CM | POA: Diagnosis present

## 2019-09-03 DIAGNOSIS — Z87891 Personal history of nicotine dependence: Secondary | ICD-10-CM | POA: Diagnosis not present

## 2019-09-03 DIAGNOSIS — O26893 Other specified pregnancy related conditions, third trimester: Secondary | ICD-10-CM | POA: Diagnosis present

## 2019-09-03 LAB — CBC
HCT: 35.4 % — ABNORMAL LOW (ref 36.0–46.0)
Hemoglobin: 12.2 g/dL (ref 12.0–15.0)
MCH: 29.6 pg (ref 26.0–34.0)
MCHC: 34.5 g/dL (ref 30.0–36.0)
MCV: 85.9 fL (ref 80.0–100.0)
Platelets: 225 10*3/uL (ref 150–400)
RBC: 4.12 MIL/uL (ref 3.87–5.11)
RDW: 13.4 % (ref 11.5–15.5)
WBC: 14.8 10*3/uL — ABNORMAL HIGH (ref 4.0–10.5)
nRBC: 0 % (ref 0.0–0.2)

## 2019-09-03 LAB — TYPE AND SCREEN
ABO/RH(D): A NEG
Antibody Screen: POSITIVE
Unit division: 0
Unit division: 0

## 2019-09-03 LAB — RUPTURE OF MEMBRANE (ROM)PLUS: Rom Plus: POSITIVE

## 2019-09-03 LAB — BPAM RBC
Blood Product Expiration Date: 202011022359
Blood Product Expiration Date: 202011022359
Unit Type and Rh: 600
Unit Type and Rh: 600

## 2019-09-03 LAB — ABO/RH: ABO/RH(D): A NEG

## 2019-09-03 LAB — RPR: RPR Ser Ql: NONREACTIVE

## 2019-09-03 LAB — SARS CORONAVIRUS 2 BY RT PCR (HOSPITAL ORDER, PERFORMED IN ~~LOC~~ HOSPITAL LAB): SARS Coronavirus 2: NEGATIVE

## 2019-09-03 MED ORDER — SOD CITRATE-CITRIC ACID 500-334 MG/5ML PO SOLN: 30.0000 mL | ORAL | Status: DC | PRN

## 2019-09-03 MED ORDER — SODIUM CHLORIDE 0.9 % IV SOLN: 1.0000 g | INTRAVENOUS | Status: DC

## 2019-09-03 MED ORDER — COCONUT OIL OIL
1.0000 "application " | TOPICAL_OIL | Status: DC | PRN
  Filled 2019-09-03: qty 120

## 2019-09-03 MED ORDER — DOCUSATE SODIUM 100 MG PO CAPS
100.0000 mg | ORAL_CAPSULE | Freq: Two times a day (BID) | ORAL | Status: DC
  Administered 2019-09-03 – 2019-09-04 (×2): 100 mg via ORAL
  Filled 2019-09-03 (×2): qty 1

## 2019-09-03 MED ORDER — FENTANYL 2.5 MCG/ML W/ROPIVACAINE 0.15% IN NS 100 ML EPIDURAL (ARMC)
EPIDURAL | Status: AC
  Filled 2019-09-03: qty 100

## 2019-09-03 MED ORDER — DIPHENHYDRAMINE HCL 50 MG/ML IJ SOLN: 12.5000 mg | INTRAMUSCULAR | Status: DC | PRN

## 2019-09-03 MED ORDER — EPHEDRINE 5 MG/ML INJ: 10.0000 mg | INTRAVENOUS | Status: DC | PRN

## 2019-09-03 MED ORDER — PHENYLEPHRINE 40 MCG/ML (10ML) SYRINGE FOR IV PUSH (FOR BLOOD PRESSURE SUPPORT): 80.0000 ug | PREFILLED_SYRINGE | INTRAVENOUS | Status: DC | PRN

## 2019-09-03 MED ORDER — ACETAMINOPHEN 325 MG PO TABS
650.0000 mg | ORAL_TABLET | ORAL | Status: DC | PRN
  Administered 2019-09-03 – 2019-09-04 (×3): 650 mg via ORAL
  Filled 2019-09-03 (×3): qty 2

## 2019-09-03 MED ORDER — LIDOCAINE HCL (PF) 1 % IJ SOLN
INTRAMUSCULAR | Status: AC
  Filled 2019-09-03: qty 30

## 2019-09-03 MED ORDER — ONDANSETRON HCL 4 MG/2ML IJ SOLN: 4.0000 mg | INTRAMUSCULAR | Status: DC | PRN

## 2019-09-03 MED ORDER — MISOPROSTOL 200 MCG PO TABS
ORAL_TABLET | ORAL | Status: AC
  Filled 2019-09-03: qty 4

## 2019-09-03 MED ORDER — IBUPROFEN 600 MG PO TABS
600.0000 mg | ORAL_TABLET | Freq: Four times a day (QID) | ORAL | Status: DC
  Administered 2019-09-03 – 2019-09-04 (×6): 600 mg via ORAL
  Filled 2019-09-03 (×6): qty 1

## 2019-09-03 MED ORDER — WITCH HAZEL-GLYCERIN EX PADS: 1.0000 "application " | MEDICATED_PAD | CUTANEOUS | Status: DC | PRN

## 2019-09-03 MED ORDER — SIMETHICONE 80 MG PO CHEW: 80.0000 mg | CHEWABLE_TABLET | ORAL | Status: DC | PRN

## 2019-09-03 MED ORDER — OXYTOCIN BOLUS FROM INFUSION
500.0000 mL | Freq: Once | INTRAVENOUS | Status: AC
  Administered 2019-09-03: 500 mL via INTRAVENOUS

## 2019-09-03 MED ORDER — PRENATAL MULTIVITAMIN CH
1.0000 | ORAL_TABLET | Freq: Every day | ORAL | Status: DC
  Administered 2019-09-03 – 2019-09-04 (×2): 1 via ORAL
  Filled 2019-09-03 (×2): qty 1

## 2019-09-03 MED ORDER — DIBUCAINE (PERIANAL) 1 % EX OINT: 1.0000 "application " | TOPICAL_OINTMENT | CUTANEOUS | Status: DC | PRN

## 2019-09-03 MED ORDER — FENTANYL CITRATE (PF) 100 MCG/2ML IJ SOLN
50.0000 ug | INTRAMUSCULAR | Status: DC | PRN
  Filled 2019-09-03: qty 2

## 2019-09-03 MED ORDER — LACTATED RINGERS IV SOLN: 500.0000 mL | INTRAVENOUS | Status: DC | PRN

## 2019-09-03 MED ORDER — OXYTOCIN 40 UNITS IN NORMAL SALINE INFUSION - SIMPLE MED
2.5000 [IU]/h | INTRAVENOUS | Status: DC
  Filled 2019-09-03: qty 1000

## 2019-09-03 MED ORDER — LACTATED RINGERS IV SOLN: 500.0000 mL | Freq: Once | INTRAVENOUS | Status: DC

## 2019-09-03 MED ORDER — LACTATED RINGERS IV SOLN
INTRAVENOUS | Status: DC
  Administered 2019-09-03: 02:00:00 via INTRAVENOUS

## 2019-09-03 MED ORDER — OXYTOCIN 10 UNIT/ML IJ SOLN
INTRAMUSCULAR | Status: AC
  Filled 2019-09-03: qty 2

## 2019-09-03 MED ORDER — FENTANYL 2.5 MCG/ML W/ROPIVACAINE 0.15% IN NS 100 ML EPIDURAL (ARMC)
12.0000 mL/h | EPIDURAL | Status: DC
  Administered 2019-09-03: 02:00:00 12 mL/h via EPIDURAL

## 2019-09-03 MED ORDER — AMMONIA AROMATIC IN INHA
RESPIRATORY_TRACT | Status: AC
  Filled 2019-09-03: qty 10

## 2019-09-03 MED ORDER — SODIUM CHLORIDE 0.9 % IV SOLN
2.0000 g | Freq: Once | INTRAVENOUS | Status: AC
  Administered 2019-09-03: 2 g via INTRAVENOUS
  Filled 2019-09-03: qty 2000

## 2019-09-03 MED ORDER — ONDANSETRON HCL 4 MG/2ML IJ SOLN: 4.0000 mg | Freq: Four times a day (QID) | INTRAMUSCULAR | Status: DC | PRN

## 2019-09-03 MED ORDER — ACETAMINOPHEN 325 MG PO TABS: 650.0000 mg | ORAL_TABLET | ORAL | Status: DC | PRN

## 2019-09-03 MED ORDER — BENZOCAINE-MENTHOL 20-0.5 % EX AERO: 1.0000 "application " | INHALATION_SPRAY | CUTANEOUS | Status: DC | PRN

## 2019-09-03 MED ORDER — BUPIVACAINE HCL (PF) 0.25 % IJ SOLN
INTRAMUSCULAR | Status: DC | PRN
  Administered 2019-09-03: 5 mL via EPIDURAL

## 2019-09-03 MED ORDER — ONDANSETRON HCL 4 MG PO TABS: 4.0000 mg | ORAL_TABLET | ORAL | Status: DC | PRN

## 2019-09-03 MED ORDER — LIDOCAINE HCL (PF) 1 % IJ SOLN: 30.0000 mL | INTRAMUSCULAR | Status: DC | PRN

## 2019-09-03 MED ORDER — DIPHENHYDRAMINE HCL 25 MG PO CAPS: 25.0000 mg | ORAL_CAPSULE | Freq: Four times a day (QID) | ORAL | Status: DC | PRN

## 2019-09-03 NOTE — Lactation Note (Signed)
This note was copied from a baby's chart. Lactation Consultation Note  Patient Name: [name] [id number] Date: 09/03/2019 Reason for consult: Initial assessment;Term  P2 mother whose infant is now 29 hours old.  This is a term infant.  Mother breast fed her first child (now 29 years old) for 5 months.  Mother experienced sore nipples with her first child and has already informed me that she "may" want to use a NS.    Baby was doing STS with mother when I arrived.  She stated that she had just finished feeding baby for 15 minutes prior to my arrival.  Mother's breasts are soft and non tender and nipples are everted and intact.  Nipples were well rounded after latching.  Mother feels like she is sensitive in her nipple area.  I encouraged her to use EBM to rub into nipples/areolas after feedings.  Suggested she ask the RN for coconut oil for comfort.  Encouraged to feed 8-12 times/24 hours or sooner if baby shows feeding cues.  Reviewed cues.  Mother is familiar with hand expression and is able to observe colostrum drops.  Container provided for any EBM she obtains with hand expression.  Milk storage times discussed and finger feeding demonstrated.  Asked mother to call for latch assistance the next time baby is ready to feed.  I would like to observe/assist.  I do not feel a NS is necessary at this time, however, if mother desires a NS I will gladly provide one with instructions.  Informed mother that I would like for her to begin pumping with the DEBP if she does desire to use a NS.  Mother verbalized understanding.    Mother is a [facility] participant and has a DEBP for home use.  Father returned at the end of our conversation with breakfast for mother.  I will await her call for my assistance.   Maternal Data Formula Feeding for Exclusion: No Has patient been taught Hand Expression?: Yes Does the patient have breastfeeding experience prior to this delivery?: Yes  Feeding Feeding Type:  Breast Fed  LATCH Score                   Interventions    Lactation Tools Discussed/Used WIC Program: Yes   Consult Status Consult Status: Follow-up Date: 09/04/19 Follow-up type: In-patient    Kamia Insalaco R Parneet Glantz 09/03/2019, 10:25 AM

## 2019-09-03 NOTE — Anesthesia Procedure Notes (Signed)
Epidural Patient location during procedure: OB  Staffing Anesthesiologist: Daisey Caloca, MD Performed: anesthesiologist   Preanesthetic Checklist Completed: patient identified, site marked, surgical consent, pre-op evaluation, timeout performed, IV checked, risks and benefits discussed and monitors and equipment checked  Epidural Patient position: sitting Prep: ChloraPrep Patient monitoring: heart rate, continuous pulse ox and blood pressure Approach: midline Location: L4-L5 Injection technique: LOR saline  Needle:  Needle type: Tuohy  Needle gauge: 18 G Needle length: 9 cm and 9 Catheter type: closed end flexible Catheter size: 20 Guage Test dose: negative and 1.5% lidocaine with Epi 1:200 K  Assessment Sensory level: T10 Events: blood not aspirated, injection not painful, no injection resistance, negative IV test and no paresthesia  Additional Notes   Patient tolerated the insertion well without complications.Reason for block:procedure for pain     

## 2019-09-03 NOTE — H&P (Signed)
Obstetric History and Physical  [name] is a 28 y.o. G2P1001 with IUP at [redacted]w[redacted]d presenting with contractions and in active labor. Patient states she has been having  irregular, every 4-6 minutes contractions, none vaginal bleeding, intact membranes, with active fetal movement.    Prenatal Course Source of Care: [facility]  Pregnancy complications or risks:elevated BMI  Prenatal labs and studies: ABO, Rh: A/Negative/-- (02/18 1008) Antibody: Negative, Negative (02/18 1008) Rubella: 3.48 (02/18 1008) RPR: Non Reactive (02/18 1008)  HBsAg: Negative (02/18 1008)  HIV: Non Reactive (02/18 1008)  GBS:--/Positive (09/24 1503) 1 hr Glucola  normal Genetic screening declined Anatomy US normal  Past Medical History:  Diagnosis Date  . Anxiety   . Heartburn     Past Surgical History:  Procedure Laterality Date  . WISDOM TOOTH EXTRACTION      OB History  Gravida Para Term Preterm AB Living  2 1 1     1   SAB TAB Ectopic Multiple Live Births          1    # Outcome Date GA Lbr Len/2nd Weight Sex Delivery Anes PTL Lv  2 Current           1 Term 05/03/17 [redacted]w[redacted]d  3033 g F Vag-Spont   LIV    Social History   Socioeconomic History  . Marital status: Married    Spouse name: Not on file  . Number of children: Not on file  . Years of education: Not on file  . Highest education level: Not on file  Occupational History  . Not on file  Social Needs  . Financial resource strain: Not on file  . Food insecurity    Worry: Not on file    Inability: Not on file  . Transportation needs    Medical: Not on file    Non-medical: Not on file  Tobacco Use  . Smoking status: Former Smoker    Quit date: 11/24/2014    Years since quitting: 4.7  . Smokeless tobacco: Never Used  Substance and Sexual Activity  . Alcohol use: No  . Drug use: No  . Sexual activity: Yes    Partners: Male    Birth control/protection: None  Lifestyle  . Physical activity    Days per week: Not on file     Minutes per session: Not on file  . Stress: Not on file  Relationships  . Social Herbalist on phone: Not on file    Gets together: Not on file    Attends religious service: Not on file    Active member of club or organization: Not on file    Attends meetings of clubs or organizations: Not on file    Relationship status: Not on file  Other Topics Concern  . Not on file  Social History Narrative  . Not on file    Family History  Problem Relation Age of Onset  . Cancer Mother        SKIN CANCER  . Hypertension Father   . Cancer Father        PROSTATE CANCER  . Seizures Brother   . Cancer Maternal Grandmother        BRAIN TUMOR  . Breast cancer Neg Hx   . Ovarian cancer Neg Hx   . Colon cancer Neg Hx     Medications Prior to Admission  Medication Sig Dispense Refill Last Dose  . doxylamine, Sleep, (UNISOM) 25 MG tablet Take 25 mg by  mouth at bedtime as needed.   09/01/2019 at Unknown time  . esomeprazole (NEXIUM) 20 MG capsule Take 20 mg by mouth daily at 12 noon.   09/02/2019 at Unknown time  . Prenat-FeFmCb-DSS-FA-DHA w/o A (CITRANATAL HARMONY) 27-1-260 MG CAPS Take 260 mg by mouth daily. 30 capsule 11 09/02/2019 at Unknown time  . triamcinolone cream (KENALOG) 0.1 % Apply 1 application topically 2 (two) times daily. (Patient not taking: Reported on 09/02/2019) 30 g 0 Not Taking at Unknown time    No Known Allergies  Review of Systems: Negative except for what is mentioned in HPI.  Physical Exam: BP 136/75 (BP Location: Left Arm)   Pulse 74   Temp 98.3 F (36.8 C) (Oral)   Resp 18   Ht 5\' 5"  (1.651 m)   Wt 103.4 kg   LMP 11/29/2018 (Exact Date)   BMI 37.94 kg/m  GENERAL: Well-developed, well-nourished female in no acute distress.  LUNGS: Clear to auscultation bilaterally.  HEART: Regular rate and rhythm. ABDOMEN: Soft, nontender, nondistended, gravid. EXTREMITIES: Nontender, no edema, 2+ distal pulses. Cervical Exam: Dilation: 5 Effacement (%):  70 Cervical Position: Posterior Station: -2 Presentation: Vertex Exam by:: A. White, RN FHT:  Baseline rate 144 bpm   Variability moderate  Accelerations present   Decelerations none Contractions: Every 3-5 mins, moderate to palpation after walking for one hour   Pertinent Labs/Studies:   Results for orders placed or performed during the hospital encounter of 09/02/19 (from the past 24 hour(s))  ROM Plus ([facility] only)     Status: None   Collection Time: 09/03/19 12:01 AM  Result Value Ref Range   Rom Plus POSITIVE     Assessment : [name] is a 28 y.o. G2P1001 at [redacted]w[redacted]d being admitted for labor.  Plan: Labor: Expectant management.  Induction/Augmentation as needed, per protocol FWB: Reassuring fetal heart tracing.  GBS positive Delivery plan: Hopeful for vaginal delivery   , CNM [facility]

## 2019-09-03 NOTE — Anesthesia Preprocedure Evaluation (Signed)
Anesthesia Evaluation  Patient identified by MRN, date of birth, ID band Patient awake    Reviewed: Allergy & Precautions, NPO status , Patient's Chart, lab work & pertinent test results, reviewed documented beta blocker date and time   Airway Mallampati: III  TM Distance: >3 FB     Dental  (+) Chipped   Pulmonary former smoker,           Cardiovascular      Neuro/Psych  Headaches, Anxiety    GI/Hepatic GERD  ,  Endo/Other  Morbid obesity  Renal/GU      Musculoskeletal   Abdominal   Peds  Hematology   Anesthesia Other Findings   Reproductive/Obstetrics                             Anesthesia Physical Anesthesia Plan  ASA: III  Anesthesia Plan: Epidural   Post-op Pain Management:    Induction: Intravenous  PONV Risk Score and Plan:   Airway Management Planned:   Additional Equipment:   Intra-op Plan:   Post-operative Plan:   Informed Consent: I have reviewed the patients History and Physical, chart, labs and discussed the procedure including the risks, benefits and alternatives for the proposed anesthesia with the patient or authorized representative who has indicated his/her understanding and acceptance.       Plan Discussed with: CRNA  Anesthesia Plan Comments:         Anesthesia Quick Evaluation

## 2019-09-03 NOTE — Progress Notes (Signed)
[name] is a 28 y.o. G2P1001 at [redacted]w[redacted]d by LMP admitted for active labor  Subjective: Desires epidural, up on bedside and anesthesia here  Objective: BP 136/75 (BP Location: Left Arm)   Pulse 74   Temp 98.3 F (36.8 C) (Oral)   Resp 18   Ht 5\' 5"  (1.651 m)   Wt 103.4 kg   LMP 11/29/2018 (Exact Date)   BMI 37.94 kg/m  No intake/output data recorded. No intake/output data recorded.  FHT:  FHR: 129 bpm, variability: moderate,  accelerations:  Present,  decelerations:  Absent UC:   regular, every 2-3 minutes SVE:   Dilation: 6.5 Effacement (%): 70 Station: -1 Exam by:: A. White, RN  Labs: Lab Results  Component Value Date   WBC 14.8 (H) 09/03/2019   HGB 12.2 09/03/2019   HCT 35.4 (L) 09/03/2019   MCV 85.9 09/03/2019   PLT 225 09/03/2019    Assessment / Plan: Spontaneous labor, progressing normally  Labor: Progressing normally Preeclampsia:  labs stable Fetal Wellbeing:  Category I Pain Control:  Labor support without medications I/D:  n/a Anticipated MOD:  NSVD  Melody N Shambley 09/03/2019, 2:09 AM

## 2019-09-04 LAB — CBC
HCT: 33.3 % — ABNORMAL LOW (ref 36.0–46.0)
Hemoglobin: 11 g/dL — ABNORMAL LOW (ref 12.0–15.0)
MCH: 29.3 pg (ref 26.0–34.0)
MCHC: 33 g/dL (ref 30.0–36.0)
MCV: 88.6 fL (ref 80.0–100.0)
Platelets: 174 10*3/uL (ref 150–400)
RBC: 3.76 MIL/uL — ABNORMAL LOW (ref 3.87–5.11)
RDW: 13.7 % (ref 11.5–15.5)
WBC: 10.1 10*3/uL (ref 4.0–10.5)
nRBC: 0 % (ref 0.0–0.2)

## 2019-09-04 LAB — FETAL SCREEN: Fetal Screen: NEGATIVE

## 2019-09-04 MED ORDER — RHO D IMMUNE GLOBULIN 1500 UNIT/2ML IJ SOSY
300.0000 ug | PREFILLED_SYRINGE | Freq: Once | INTRAMUSCULAR | Status: AC
  Administered 2019-09-04: 300 ug via INTRAVENOUS
  Filled 2019-09-04: qty 2

## 2019-09-04 MED ORDER — NORETHINDRONE 0.35 MG PO TABS: 1.0000 | ORAL_TABLET | Freq: Every day | ORAL | 11 refills | Status: DC

## 2019-09-04 MED ORDER — IBUPROFEN 600 MG PO TABS: 600.0000 mg | ORAL_TABLET | Freq: Four times a day (QID) | ORAL | 0 refills | Status: DC

## 2019-09-04 MED ORDER — VARICELLA VIRUS VACCINE LIVE 1350 PFU/0.5ML IJ SUSR
0.5000 mL | Freq: Once | INTRAMUSCULAR | Status: AC
  Administered 2019-09-04: 0.5 mL via SUBCUTANEOUS
  Filled 2019-09-04 (×2): qty 0.5

## 2019-09-04 NOTE — Progress Notes (Signed)
Discharge instructions, prescriptions, education, and appointments given and explained. Pt verbalized understanding with no further questions. Pt wheeled in wheelchair to personal vehicle per staff, accompanied by husband, belongings, and holding infant.

## 2019-09-04 NOTE — Anesthesia Postprocedure Evaluation (Signed)
Anesthesia Post Note  Patient: [name]  Procedure(s) Performed: AN AD HOC LABOR EPIDURAL  Patient location during evaluation: Mother Baby Anesthesia Type: Epidural Level of consciousness: awake and alert Pain management: pain level controlled Vital Signs Assessment: post-procedure vital signs reviewed and stable Respiratory status: spontaneous breathing, nonlabored ventilation and respiratory function stable Cardiovascular status: stable Postop Assessment: no headache, adequate PO intake and able to ambulate Anesthetic complications: no Comments: Mild backache at the site.  No other complaints.  Using the restroom and ambulating without difficulty.  No weakness noted.     Last Vitals:  Vitals:   09/03/19 2348 09/04/19 0840  BP: 123/62 121/72  Pulse: 71 74  Resp: 18 18  Temp: 37.3 C 36.8 C  SpO2: 98% 98%    Last Pain:  Vitals:   09/04/19 0840  TempSrc: Oral  PainSc:                  Martha Clan

## 2019-09-04 NOTE — Discharge Summary (Signed)
Obstetric Discharge Summary Reason for Admission: onset of labor Prenatal Procedures: ultrasound Intrapartum Procedures: spontaneous vaginal delivery Postpartum Procedures: Rho(D) Ig Complications-Operative and Postpartum: none  Delivery Note At 5:59 AM a viable and healthy female was delivered via Vaginal, Spontaneous (Presentation: ;  ).  APGAR: 8, 9; weight 6 lb 2.1 oz (2780 g).      .     H/H:  Lab Results  Component Value Date/Time   HGB 11.0 (L) 09/04/2019 06:27 AM   HGB 12.5 02/06/2017 09:30 AM   HCT 33.3 (L) 09/04/2019 06:27 AM   HCT 36.1 02/06/2017 09:30 AM    Discharge Diagnoses: Term Pregnancy-delivered  Discharge Information: Date: 09/04/2019 Activity: pelvic rest Diet: routine Baby feeding: plans to breastfeed Contraception: oral progesterone-only contraceptive Medications: PNV and Ibuprofen Condition: stable Instructions: refer to practice specific booklet Discharge to: home   Jorian Willhoite,CNM 09/04/2019,10:38 AM

## 2019-09-04 NOTE — Progress Notes (Signed)
Nipple shield given to pt per request. lactation saw pt yesterday and pt states they discussed NS. Pt states understanding of use of NS and requests larger size of NS available. Pt given 44mm NS at this time. Pt has no further questions at this time. Will continue to monitor.

## 2019-09-04 NOTE — Progress Notes (Signed)
Post Partum Day 1 Subjective: no complaints and up ad lib  Objective: Blood pressure 121/72, pulse 74, temperature 98.2 F (36.8 C), temperature source Oral, resp. rate 18, height 5\' 5"  (1.651 m), weight 103.4 kg, last menstrual period 11/29/2018, SpO2 98 %, unknown if currently breastfeeding.  Physical Exam:  General: alert, cooperative and appears stated age Lochia: appropriate Uterine Fundus: firm Incision: NA DVT Evaluation: No evidence of DVT seen on physical exam. Negative Homan's sign.  Recent Labs    09/03/19 0042 09/04/19 0627  HGB 12.2 11.0*  HCT 35.4* 33.3*    Assessment/Plan: Discharge home and Breastfeeding Infant feeding Breast    LOS: 1 day   Demri Poulton N Sheran Newstrom 09/04/2019, 10:35 AM

## 2019-09-05 LAB — RHOGAM INJECTION: Unit division: 0

## 2019-09-07 ENCOUNTER — Encounter: Payer: Medicaid Other | Admitting: Obstetrics and Gynecology

## 2019-09-07 ENCOUNTER — Other Ambulatory Visit: Payer: Medicaid Other

## 2019-10-21 ENCOUNTER — Encounter: Payer: Medicaid Other | Admitting: Obstetrics and Gynecology

## 2019-11-08 ENCOUNTER — Ambulatory Visit (INDEPENDENT_AMBULATORY_CARE_PROVIDER_SITE_OTHER): Payer: Medicaid Other | Admitting: Certified Nurse Midwife

## 2019-11-08 ENCOUNTER — Encounter: Payer: Self-pay | Admitting: Certified Nurse Midwife

## 2019-11-08 ENCOUNTER — Other Ambulatory Visit: Payer: Self-pay

## 2019-11-08 ENCOUNTER — Encounter: Payer: Medicaid Other | Admitting: Certified Nurse Midwife

## 2019-11-08 MED ORDER — NORETHIN ACE-ETH ESTRAD-FE 1-20 MG-MCG PO TABS: 1.0000 | ORAL_TABLET | Freq: Every day | ORAL | 11 refills | Status: DC

## 2019-11-08 NOTE — Progress Notes (Signed)
Subjective:    Shweta Aman is a 28 y.o. G54P2002 Caucasian female who presents for a postpartum visit. She is 6 weeks postpartum following a spontaneous vaginal delivery at 39.5 gestational weeks. Anesthesia: epidural. I have fully reviewed the prenatal and intrapartum course. Postpartum course has been WNL. Baby's course has been WNL Baby is feeding by bottle. Bleeding no bleeding. Bowel function is normal. Bladder function is normal. Patient is sexually active.Contraception method is OCP (estrogen/progesterone). Postpartum depression screening: negative. Score 1.  Last pap 11/12/17 and was normal.  The following portions of the patient's history were reviewed and updated as appropriate: allergies, current medications, past medical history, past surgical history and problem list.  Review of Systems Pertinent items are noted in HPI.   Vitals:   11/08/19 1401  BP: 133/79  Pulse: 68  Weight: 225 lb 9 oz (102.3 kg)  Height: 5\' 5"  (1.651 m)   Patient's last menstrual period was 11/04/2019 (exact date).  Objective:   General:  alert, cooperative and no distress   Breasts:  deferred, no complaints  Lungs: clear to auscultation bilaterally  Heart:  regular rate and rhythm  Abdomen: soft, nontender   Vulva: normal  Vagina: normal vagina  Cervix:  closed  Corpus: Well-involuted  Adnexa:  Non-palpable  Rectal Exam: no hemorrhoids        Assessment:   Postpartum exam 6 wks s/p SVD Bottle feeding Depression screening Contraception counseling   Plan:  : OCP (estrogen/progesterone) Follow up in: 1 yr for annual  or earlier if needed  Philip Aspen, CNM

## 2019-11-08 NOTE — Patient Instructions (Signed)
Preventive Care 21-29 Years Old, Female Preventive care refers to visits with your health care provider and lifestyle choices that can promote health and wellness. This includes:  A yearly physical exam. This may also be called an annual well check.  Regular dental visits and eye exams.  Immunizations.  Screening for certain conditions.  Healthy lifestyle choices, such as eating a healthy diet, getting regular exercise, not using drugs or products that contain nicotine and tobacco, and limiting alcohol use. What can I expect for my preventive care visit? Physical exam Your health care provider will check your:  Height and weight. This may be used to calculate body mass index (BMI), which tells if you are at a healthy weight.  Heart rate and blood pressure.  Skin for abnormal spots. Counseling Your health care provider may ask you questions about your:  Alcohol, tobacco, and drug use.  Emotional well-being.  Home and relationship well-being.  Sexual activity.  Eating habits.  Work and work environment.  Method of birth control.  Menstrual cycle.  Pregnancy history. What immunizations do I need?  Influenza (flu) vaccine  This is recommended every year. Tetanus, diphtheria, and pertussis (Tdap) vaccine  You may need a Td booster every 10 years. Varicella (chickenpox) vaccine  You may need this if you have not been vaccinated. Human papillomavirus (HPV) vaccine  If recommended by your health care provider, you may need three doses over 6 months. Measles, mumps, and rubella (MMR) vaccine  You may need at least one dose of MMR. You may also need a second dose. Meningococcal conjugate (MenACWY) vaccine  One dose is recommended if you are age 19-21 years and a first-year college student living in a residence hall, or if you have one of several medical conditions. You may also need additional booster doses. Pneumococcal conjugate (PCV13) vaccine  You may need  this if you have certain conditions and were not previously vaccinated. Pneumococcal polysaccharide (PPSV23) vaccine  You may need one or two doses if you smoke cigarettes or if you have certain conditions. Hepatitis A vaccine  You may need this if you have certain conditions or if you travel or work in places where you may be exposed to hepatitis A. Hepatitis B vaccine  You may need this if you have certain conditions or if you travel or work in places where you may be exposed to hepatitis B. Haemophilus influenzae type b (Hib) vaccine  You may need this if you have certain conditions. You may receive vaccines as individual doses or as more than one vaccine together in one shot (combination vaccines). Talk with your health care provider about the risks and benefits of combination vaccines. What tests do I need?  Blood tests  Lipid and cholesterol levels. These may be checked every 5 years starting at age 20.  Hepatitis C test.  Hepatitis B test. Screening  Diabetes screening. This is done by checking your blood sugar (glucose) after you have not eaten for a while (fasting).  Sexually transmitted disease (STD) testing.  BRCA-related cancer screening. This may be done if you have a family history of breast, ovarian, tubal, or peritoneal cancers.  Pelvic exam and Pap test. This may be done every 3 years starting at age 21. Starting at age 30, this may be done every 5 years if you have a Pap test in combination with an HPV test. Talk with your health care provider about your test results, treatment options, and if necessary, the need for more tests.   Follow these instructions at home: Eating and drinking   Eat a diet that includes fresh fruits and vegetables, whole grains, lean protein, and low-fat dairy.  Take vitamin and mineral supplements as recommended by your health care provider.  Do not drink alcohol if: ? Your health care provider tells you not to drink. ? You are  pregnant, may be pregnant, or are planning to become pregnant.  If you drink alcohol: ? Limit how much you have to 0-1 drink a day. ? Be aware of how much alcohol is in your drink. In the [place] one drink equals one 12 oz bottle of beer (355 mL), one 5 oz glass of wine (148 mL), or one 1 oz glass of hard liquor (44 mL). Lifestyle  Take daily care of your teeth and gums.  Stay active. Exercise for at least 30 minutes on 5 or more days each week.  Do not use any products that contain nicotine or tobacco, such as cigarettes, e-cigarettes, and chewing tobacco. If you need help quitting, ask your health care provider.  If you are sexually active, practice safe sex. Use a condom or other form of birth control (contraception) in order to prevent pregnancy and STIs (sexually transmitted infections). If you plan to become pregnant, see your health care provider for a preconception visit. What's next?  Visit your health care provider once a year for a well check visit.  Ask your health care provider how often you should have your eyes and teeth checked.  Stay up to date on all vaccines. This information is not intended to replace advice given to you by your health care provider. Make sure you discuss any questions you have with your health care provider. Document Released: 01/13/2002 Document Revised: 07/29/2018 Document Reviewed: 07/29/2018 Elsevier Patient Education  2020 Elsevier Inc.  

## 2019-11-18 ENCOUNTER — Encounter: Payer: Medicaid Other | Admitting: Certified Nurse Midwife

## 2019-11-22 ENCOUNTER — Other Ambulatory Visit: Payer: Self-pay | Admitting: Certified Nurse Midwife

## 2019-11-22 MED ORDER — NORGESTIMATE-ETH ESTRADIOL 0.25-35 MG-MCG PO TABS: 1.0000 | ORAL_TABLET | Freq: Every day | ORAL | 11 refills | Status: DC

## 2019-11-22 NOTE — Progress Notes (Signed)
Orders changed per pt request, due to break through bleeding.   [name], CNM

## 2019-12-25 ENCOUNTER — Encounter: Payer: Self-pay | Admitting: Physician Assistant

## 2019-12-29 ENCOUNTER — Ambulatory Visit: Payer: Medicaid Other | Attending: Internal Medicine

## 2019-12-29 DIAGNOSIS — Z20822 Contact with and (suspected) exposure to covid-19: Secondary | ICD-10-CM | POA: Diagnosis not present

## 2019-12-30 ENCOUNTER — Telehealth (INDEPENDENT_AMBULATORY_CARE_PROVIDER_SITE_OTHER): Payer: Medicaid Other | Admitting: Physician Assistant

## 2019-12-30 DIAGNOSIS — L409 Psoriasis, unspecified: Secondary | ICD-10-CM

## 2019-12-30 DIAGNOSIS — G43909 Migraine, unspecified, not intractable, without status migrainosus: Secondary | ICD-10-CM

## 2019-12-30 DIAGNOSIS — L210 Seborrhea capitis: Secondary | ICD-10-CM

## 2019-12-30 DIAGNOSIS — R634 Abnormal weight loss: Secondary | ICD-10-CM | POA: Diagnosis not present

## 2019-12-30 LAB — NOVEL CORONAVIRUS, NAA: SARS-CoV-2, NAA: NOT DETECTED

## 2019-12-30 MED ORDER — KETOCONAZOLE 2 % EX SHAM: 1.0000 "application " | MEDICATED_SHAMPOO | CUTANEOUS | 0 refills | Status: DC

## 2019-12-30 MED ORDER — CLOBETASOL PROPIONATE 0.05 % EX SOLN: 1.0000 "application " | Freq: Two times a day (BID) | CUTANEOUS | 0 refills | Status: DC

## 2019-12-30 MED ORDER — TOPIRAMATE 50 MG PO TABS: ORAL_TABLET | ORAL | 1 refills | Status: DC

## 2019-12-30 NOTE — Progress Notes (Signed)
Patient: [name] Female    DOB: [birth year]   29 y.o.   MRN: [id number] Visit Date: 12/30/2019  Today's Provider: Trey Sailors, PA-C   Chief Complaint  Patient presents with  . Weight Loss  . Alopecia   Subjective:    Virtual Visit via Video Note  I connected with Carlis Abbott on 12/30/19 at 10:40 AM EST by a video enabled telemedicine application and verified that I am speaking with the correct person using two identifiers.  Location: Patient: Home Provider: Office    I discussed the limitations of evaluation and management by telemedicine and the availability of in person appointments. The patient expressed understanding and agreed to proceed.  HPI  Weight Loss Patient presents today for weight loss. Patient states that she still has weight from after having her baby and would like to lose the weight. Previously on B12 shots and pills for weight loss with OBGYN. Second child delivered in 09/2019. Previous midwife left and single midwife. Exercising since January of this year. She reports she is having hard times with cravings. Not breast feedings.   Typical day of Eating: Avocado toast and bacon for breakfast, fish fillet sandwich from [employer], [place] chicken with onions and rice, trying to reduce snacking. She is drinking soda, which she has reduced. Previously drinking 2-3 cups of diet pepsi.   Exercising: Dance classes, HIIT classes, pilates.   Wt Readings from Last 3 Encounters:  11/08/19 225 lb 9 oz (102.3 kg)  09/02/19 228 lb (103.4 kg)  09/02/19 228 lb 9 oz (103.7 kg)      Hair Loss Patient states that she is having hair loss and is getting bald spots in the front of her head. Patient states that she noticed it the last couple of weeks.  Psoriasis: Reports she has several plaques that remain but this has largely improved. She has steroid cream. She is also reporting red flaky patches on her scalp.   Contraception: She is interested  in long acting contraception as she does not want any more kids. Currently using OCP, history of irregular periods and also menorrhagia.    No Known Allergies   Current Outpatient Medications:  .  esomeprazole (NEXIUM) 20 MG capsule, Take 20 mg by mouth daily at 12 noon., Disp: , Rfl:  .  ibuprofen (ADVIL) 600 MG tablet, Take 1 tablet (600 mg total) by mouth every 6 (six) hours., Disp: 30 tablet, Rfl: 0 .  norgestimate-ethinyl estradiol (ORTHO-CYCLEN) 0.25-35 MG-MCG tablet, Take 1 tablet by mouth daily., Disp: 1 Package, Rfl: 11 .  Prenat-FeFmCb-DSS-FA-DHA w/o A (CITRANATAL HARMONY) 27-1-260 MG CAPS, Take 260 mg by mouth daily. (Patient not taking: Reported on 11/08/2019), Disp: 30 capsule, Rfl: 11  Review of Systems  Constitutional: Negative.   Respiratory: Negative.   Cardiovascular: Negative.   Neurological: Negative.   Psychiatric/Behavioral: Negative.     Social History   Tobacco Use  . Smoking status: Former Smoker    Quit date: 11/24/2014    Years since quitting: 5.1  . Smokeless tobacco: Never Used  Substance Use Topics  . Alcohol use: No      Objective:   There were no vitals taken for this visit. There were no vitals filed for this visit.There is no height or weight on file to calculate BMI.   Physical Exam Constitutional:      Appearance: Normal appearance.  Pulmonary:     Effort: Pulmonary effort is normal. No respiratory distress.  Neurological:  Mental Status: She is alert.  Psychiatric:        Mood and Affect: Mood normal.        Behavior: Behavior normal.      No results found for any visits on 12/30/19.     Assessment & Plan    1. Weight loss  Follow up 1 month. Can schedule IUD appointment here as her OBGYN clinic is very busy.   - topiramate (TOPAMAX) 50 MG tablet; Take 25 mg before bed x 1 week. Then take 50 mg before bed x 1 week. Then take 75 mg before bed x 1 week. Then take 100 mg before bed x 1wk  Dispense: 180 tablet; Refill:  1  2. Migraine without status migrainosus, not intractable, unspecified migraine type  - topiramate (TOPAMAX) 50 MG tablet; Take 25 mg before bed x 1 week. Then take 50 mg before bed x 1 week. Then take 75 mg before bed x 1 week. Then take 100 mg before bed x 1wk  Dispense: 180 tablet; Refill: 1  3. Psoriasis  - clobetasol (TEMOVATE) 0.05 % external solution; Apply 1 application topically 2 (two) times daily.  Dispense: 50 mL; Refill: 0  4. Dandruff  - ketoconazole (NIZORAL) 2 % shampoo; Apply 1 application topically 2 (two) times a week.  Dispense: 120 mL; Refill: 0 I discussed the assessment and treatment plan with the patient. The patient was provided an opportunity to ask questions and all were answered. The patient agreed with the plan and demonstrated an understanding of the instructions.   The patient was advised to call back or seek an in-person evaluation if the symptoms worsen or if the condition fails to improve as anticipated.  I provided 25 minutes of non-face-to-face time during this encounter.  The entirety of the information documented in the History of Present Illness, Review of Systems and Physical Exam were personally obtained by me. Portions of this information were initially documented by [facility] and reviewed by me for thoroughness and accuracy.      Trinna Post, PA-C  [facility]

## 2019-12-30 NOTE — Patient Instructions (Signed)
What is a Vasectomy? ShareFacebookTwitterLinkedInEmail Vasectomy is minor surgery to block sperm from reaching the semen that is ejaculated from the penis. Semen still exists, but it has no sperm in it. After a vasectomy the testes still make sperm, but they are soaked up by the body. Each year, more than 500,000 men in the [place] choose vasectomy for birth control. A vasectomy prevents pregnancy better than any other method of birth control, except abstinence.  Sperm and female sex hormones are made in the testicles. Sperm is the female reproductive cells made in the testicles that can fertilize a female partner's eggs, which may result in a child. The testes are in the scrotum at the base of the penis. Sperm leave the testes through a coiled tube (the "epididymis"), where they stay until they're ready for use. Each epididymis is linked to the ejaculatory duct by a long tube called the vas deferens (or "vas"). The vas runs from the lower part of the scrotum into the inguinal canal (groin area). It then goes into the pelvis and behind the bladder. This is where the vas deferens joins with the seminal vesicle and forms the ejaculatory duct. When you ejaculate, seminal fluid from the seminal vesicles mix with sperm to form semen. The semen flows through the urethra and comes out the end of your penis. Ejaculate with sperm may cause a pregnancy.   Diagram of the Female Reproductive System Enlarge Treatment Vasectomies are usually done in your urologist's office, but they may also be done at a surgery center or in a hospital. You and your urologist may decide if you need to be fully sedated (put to sleep) for the procedure. If you need to be sedated, you may have your vasectomy at a surgery center or hospital. The need for sedation is based on your anatomy, how nervous you are, or if you might need other surgery at the same time. You may be asked to sign a form that gives your urologist permission to  do the procedure. Some states have special laws about the type of consent and when you need to sign it.  In the procedure room, your scrotal area will be shaved and washed with an antiseptic solution. Local anesthesia will be injected to numb the area, but you'll be aware of touch, tension, and movement. The local anesthetic should block any sharp pain. If you feel pain during the procedure, you can let your urologist know so you can get more anesthesia.   Diagram of Vasectomy Enlarge Conventional Vasectomy For a conventional vasectomy, one or two small cuts are made in the skin of the scrotum to reach the vas deferens. The vas deferens is cut and a small piece may be removed, leaving a short gap between the two ends. Next, the urologist may cut the ends of the vas and then tie the cut ends or put some tissue in between them. These steps are then repeated on the other vas, either through the same cut or through a new one. The scrotal cuts may be closed with dissolvable stitches or allowed to close on their own.  No-Scalpel Vasectomy For a no-scalpel vasectomy, the urologist feels for the vas under the skin of the scrotum and holds it in place with a small clamp. A tiny hole is made in the skin and stretched open so the vas deferens can be gently lifted out. It is then cut, tied or seared, and put back in place.  What are the Risks? Right after surgery, there's a small risk of bleeding into the scrotum. If you notice that your scrotum has gotten much bigger or you are in pain, call your urologist right away. If you have a fever, or your scrotum is red or sore, you should have your urologist check for infection. There is a small risk for post-vasectomy pain syndrome. This occurs in 1 or 2 men out of 100 vasectomies. Post-vasectomy pain syndrome is a pain that can follow a vasectomy. It isn't clear what causes this in many cases, but it's most often treated with anti-swelling meds. If this occurs, see  your urologist as sometimes the specific cause can be treated with medicine or a minor procedure.  Studies show men who have had a vasectomy are not at a higher risk for any other medical conditions such as heart disease, prostate cancer, testicular cancer, or other health problems.  After Treatment After your vasectomy, you may be uncomfortable for a few days. To reduce your pain, you may need mild pain medication to take care of any pain. Severe pain may suggest infection or other problems and you should see your urologist. You may have mild pain like what you'd feel like several minutes after getting hit "down there." A benign lump (granuloma) may form from sperm leaking from the cut end of the vas into the scrotal tissues. It may be painful or sensitive to touch or pressure, but it isn't harmful. Most of the time, you don't feel pain and this usually gets better with time.  Your urologist will give you instructions for care after a vasectomy. Most men go home right away after the procedure. You should avoid sex for 3-7 days or activities that take a lot of strength. Swelling and pain can be treated with an ice pack on the scrotum and wearing a supportive undergarment, such as a jockstrap. Most men fully heal in less than a week. Many men are able to return to their job as early as the next day if they do desk work.  Sex can often be resumed within a week after the vasectomy, but it's important to know that a vasectomy doesn't work right away. After the vasectomy, new sperm won't be able to get into the semen, but there will still be lots of sperm "in the pipeline" that takes time to clear. You should follow up with your urologist for semen analysis to check for sperm in your ejaculate. During this time, you should use other forms of birth control.  The time it takes for your ejaculate to be free of sperm can differ. Most urologists suggest waiting to check the semen for at least 3 months and/or 20  ejaculates. One in 100 men will still have sperm in their ejaculate at that time and may need to wait longer for the sperm to clear. You shouldn't assume that your vasectomy is effective until a semen analysis proves it is.  Frequently Asked Questions Can my partner tell if I have had a vasectomy? Sperm adds very little to the semen volume, so you shouldn't notice any change in your ejaculate after vasectomy. Your partner may sometimes be able to feel the vasectomy site. This is particularly true if you have developed a granuloma.  Will my sense of orgasm be changed by having a vasectomy? Ejaculation and orgasm are not affected by vasectomy. The special case is the rare man who has developed post-vasectomy pain syndrome.  Can I develop erectile dysfunction  after a vasectomy? A vasectomy does not cause erectile dysfunction.  Can a vasectomy fail? There is a small chance a vasectomy may fail. This occurs when sperm leaking from one end of the cut vas deferens find a channel to the other cut end. Once your urologist clears you with a sperm test showing no sperm or less than 100,000 sperm, with none moving, the risk of pregnancy is 1 in 2000.  Can something happen to my testicles? In rare cases, the testicular artery may be hurt during vasectomy. Other problems, such as a mass of blood (hematoma) or infection, may also affect the testicles.  Can I have children after my vasectomy? Yes, but if you haven't stored frozen sperm you'll need an additional procedure. The vas deferens can be surgically reconnected in a procedure called vasectomy reversal. If you don't want to have vasectomy reversal, sperm can be taken from the testicle or the epididymis and used for in vitro fertilization. These procedures are costly and may not be covered by your health plan. Also, they don't always work. If you think you may want to have children one day, you should look into non-permanent forms of birth control before  deciding to have a vasectomy.

## 2020-01-10 ENCOUNTER — Other Ambulatory Visit: Payer: Self-pay

## 2020-01-10 ENCOUNTER — Ambulatory Visit (INDEPENDENT_AMBULATORY_CARE_PROVIDER_SITE_OTHER): Payer: Medicaid Other | Admitting: Family Medicine

## 2020-01-10 ENCOUNTER — Encounter: Payer: Self-pay | Admitting: Physician Assistant

## 2020-01-10 ENCOUNTER — Encounter: Payer: Self-pay | Admitting: Family Medicine

## 2020-01-10 VITALS — BP 136/82 | HR 91 | Temp 97.1°F | Wt 231.0 lb

## 2020-01-10 DIAGNOSIS — N920 Excessive and frequent menstruation with regular cycle: Secondary | ICD-10-CM | POA: Diagnosis not present

## 2020-01-10 DIAGNOSIS — Z30430 Encounter for insertion of intrauterine contraceptive device: Secondary | ICD-10-CM

## 2020-01-10 LAB — POCT URINE PREGNANCY: Preg Test, Ur: NEGATIVE

## 2020-01-10 MED ORDER — LEVONORGESTREL 20 MCG/24HR IU IUD
1.0000 | INTRAUTERINE_SYSTEM | Freq: Once | INTRAUTERINE | Status: AC
  Administered 2020-01-10: 1 via INTRAUTERINE

## 2020-01-10 NOTE — Telephone Encounter (Signed)
Patient schedule appointment for tomorrow, 01/11/2020 @ 1:20PM.

## 2020-01-10 NOTE — Patient Instructions (Signed)

## 2020-01-10 NOTE — Progress Notes (Signed)
Patient: [name] Female    DOB: 02-01-1991   29 y.o.   MRN: [id number] Visit Date: 01/10/2020  Today's Provider: Lavon Paganini, MD   Chief Complaint  Patient presents with  . Contraception   Subjective:     HPI  She is 4 months postpartum.  She is desiring contraception.  She is worried about mising pills and has only taken OCPs in the past.  Periods have been heavy since having her baby.  Interested in IUD, unsure of type  No Known Allergies   Current Outpatient Medications:  .  clobetasol (TEMOVATE) 0.05 % external solution, Apply 1 application topically 2 (two) times daily., Disp: 50 mL, Rfl: 0 .  esomeprazole (NEXIUM) 20 MG capsule, Take 20 mg by mouth daily at 12 noon., Disp: , Rfl:  .  ibuprofen (ADVIL) 600 MG tablet, Take 1 tablet (600 mg total) by mouth every 6 (six) hours., Disp: 30 tablet, Rfl: 0 .  ketoconazole (NIZORAL) 2 % shampoo, Apply 1 application topically 2 (two) times a week., Disp: 120 mL, Rfl: 0 .  norgestimate-ethinyl estradiol (ORTHO-CYCLEN) 0.25-35 MG-MCG tablet, Take 1 tablet by mouth daily., Disp: 1 Package, Rfl: 11 .  topiramate (TOPAMAX) 50 MG tablet, Take 25 mg before bed x 1 week. Then take 50 mg before bed x 1 week. Then take 75 mg before bed x 1 week. Then take 100 mg before bed x 1wk, Disp: 180 tablet, Rfl: 1 .  Prenat-FeFmCb-DSS-FA-DHA w/o A (CITRANATAL HARMONY) 27-1-260 MG CAPS, Take 260 mg by mouth daily. (Patient not taking: Reported on 01/10/2020), Disp: 30 capsule, Rfl: 11  Review of Systems  Social History   Tobacco Use  . Smoking status: Former Smoker    Quit date: 11/24/2014    Years since quitting: 5.1  . Smokeless tobacco: Never Used  Substance Use Topics  . Alcohol use: No      Objective:   BP 136/82 (BP Location: Right Arm, Patient Position: Sitting, Cuff Size: Large)   Pulse 91   Temp (!) 97.1 F (36.2 C) (Temporal)   Wt 231 lb (104.8 kg)   SpO2 98%   BMI 38.44 kg/m  Vitals:   01/10/20 0929  BP:  136/82  Pulse: 91  Temp: (!) 97.1 F (36.2 C)  TempSrc: Temporal  SpO2: 98%  Weight: 231 lb (104.8 kg)  Body mass index is 38.44 kg/m.   Physical Exam Vitals reviewed.  Constitutional:      General: She is not in acute distress.    Appearance: Normal appearance. She is well-developed. She is not diaphoretic.  HENT:     Head: Normocephalic and atraumatic.  Eyes:     General: No scleral icterus.    Conjunctiva/sclera: Conjunctivae normal.  Cardiovascular:     Rate and Rhythm: Normal rate and regular rhythm.  Pulmonary:     Effort: Pulmonary effort is normal. No respiratory distress.  Genitourinary:    Comments: GYN:  External genitalia within normal limits.  Vaginal mucosa pink, moist, normal rugae.  Nonfriable cervix without lesions, no discharge or bleeding noted on speculum exam.  Skin:    General: Skin is warm and dry.     Capillary Refill: Capillary refill takes less than 2 seconds.     Findings: No rash.  Neurological:     Mental Status: She is alert and oriented to person, place, and time.  Psychiatric:        Behavior: Behavior normal.    No results found for  any visits on 01/10/20.     Assessment & Plan    1. Encounter for IUD insertion 2. Menorrhagia with Regular Cycle - has used OCPs in the past - looking for LARC as she does not currently want more children, but is unsure about the future - discussed types of IUDs, as well as risks and benefits - patient elects to go ahead with Mirena IUD insertion today - see procedure note - levonorgestrel (MIRENA) 20 MCG/24HR IUD 1 each   Return in about 4 weeks (around 02/07/2020) for IUD string check.   The entirety of the information documented in the History of Present Illness, Review of Systems and Physical Exam were personally obtained by me. Portions of this information were initially documented by Kavin Leech, CMA and reviewed by me for thoroughness and accuracy.    Tyisha Cressy, Marzella Schlein, MD MPH [facility]

## 2020-01-10 NOTE — Progress Notes (Signed)
IUD Insertion Procedure Note  Pre-operative Diagnosis: desire for contraception, menorrhagia  Post-operative Diagnosis: normal  Indications: contraception  Procedure Details  Urine pregnancy test was done and result was negative.  The risks (including infection, bleeding, pain, and uterine perforation) and benefits of the procedure were explained to the patient and Written informed consent was obtained.    Cervix cleansed with Betadine. Tenaculum applied to anterior cervix.  Uterus sounded to 8 cm.  Mirena attempted to be inserted and promptly expulsed.  2nd Mirena IUD inserted without difficulty. String visible and trimmed. Patient tolerated procedure well.  IUD Information: Toney Reil, Lot # [id number], Expiration date Jun 2022.  Condition: Stable  Complications: None  Plan: The patient was advised to call for any fever or for prolonged or severe pain or bleeding. She was advised to use OTC analgesics as needed for mild to moderate pain.    Makisha Marrin, Marzella Schlein, MD, MPH [facility]

## 2020-01-11 ENCOUNTER — Telehealth (INDEPENDENT_AMBULATORY_CARE_PROVIDER_SITE_OTHER): Payer: Medicaid Other | Admitting: Physician Assistant

## 2020-01-11 DIAGNOSIS — J40 Bronchitis, not specified as acute or chronic: Secondary | ICD-10-CM | POA: Diagnosis not present

## 2020-01-11 MED ORDER — PREDNISONE 10 MG PO TABS: ORAL_TABLET | ORAL | 0 refills | Status: DC

## 2020-01-11 MED ORDER — ALBUTEROL SULFATE HFA 108 (90 BASE) MCG/ACT IN AERS: 2.0000 | INHALATION_SPRAY | Freq: Four times a day (QID) | RESPIRATORY_TRACT | 1 refills | Status: DC | PRN

## 2020-01-11 NOTE — Progress Notes (Signed)
Patient: [name] Female    DOB: Apr 14, 1991   28 y.o.   MRN: [id number] Visit Date: 01/11/2020  Today's Provider: Trey Sailors, PA-C   Chief Complaint  Patient presents with  . Cough   Subjective:    Virtual Visit via Video Note  I connected with [name] on 01/11/20 at  1:20 PM EST by a video enabled telemedicine application and verified that I am speaking with the correct person using two identifiers.  Location: Patient: Home Provider: Office   I discussed the limitations of evaluation and management by telemedicine and the availability of in person appointments. The patient expressed understanding and agreed to proceed.  Cough This is a new problem. The current episode started in the past 7 days. The problem has been gradually worsening. The cough is productive of sputum. Associated symptoms include nasal congestion, postnasal drip and wheezing. Pertinent negatives include no chills, fever, headaches, rhinorrhea, sore throat or shortness of breath. The symptoms are aggravated by exercise. She has tried OTC cough suppressant for the symptoms. The treatment provided no relief. Her past medical history is significant for asthma and environmental allergies.   Had URI symptoms 2+ weeks ago and was tested for COVID on 12/29/2019 which was negative. Reports she has had worsening cough since then. Reports some mild wheezing. No facial pain or pressure.  No Known Allergies   Current Outpatient Medications:  .  clobetasol (TEMOVATE) 0.05 % external solution, Apply 1 application topically 2 (two) times daily., Disp: 50 mL, Rfl: 0 .  esomeprazole (NEXIUM) 20 MG capsule, Take 20 mg by mouth daily at 12 noon., Disp: , Rfl:  .  ibuprofen (ADVIL) 600 MG tablet, Take 1 tablet (600 mg total) by mouth every 6 (six) hours., Disp: 30 tablet, Rfl: 0 .  ketoconazole (NIZORAL) 2 % shampoo, Apply 1 application topically 2 (two) times a week., Disp: 120 mL, Rfl: 0 .   norgestimate-ethinyl estradiol (ORTHO-CYCLEN) 0.25-35 MG-MCG tablet, Take 1 tablet by mouth daily., Disp: 1 Package, Rfl: 11 .  topiramate (TOPAMAX) 50 MG tablet, Take 25 mg before bed x 1 week. Then take 50 mg before bed x 1 week. Then take 75 mg before bed x 1 week. Then take 100 mg before bed x 1wk, Disp: 180 tablet, Rfl: 1 .  Prenat-FeFmCb-DSS-FA-DHA w/o A (CITRANATAL HARMONY) 27-1-260 MG CAPS, Take 260 mg by mouth daily. (Patient not taking: Reported on 01/10/2020), Disp: 30 capsule, Rfl: 11  Review of Systems  Constitutional: Negative for appetite change, chills and fever.  HENT: Positive for congestion, postnasal drip and voice change. Negative for rhinorrhea, sinus pressure, sinus pain, sneezing and sore throat.   Respiratory: Positive for cough, chest tightness and wheezing. Negative for shortness of breath.   Gastrointestinal: Negative for nausea and vomiting.  Allergic/Immunologic: Positive for environmental allergies.  Neurological: Negative for weakness and headaches.    Social History   Tobacco Use  . Smoking status: Former Smoker    Quit date: 11/24/2014    Years since quitting: 5.1  . Smokeless tobacco: Never Used  Substance Use Topics  . Alcohol use: No      Objective:   There were no vitals taken for this visit. There were no vitals filed for this visit.There is no height or weight on file to calculate BMI.   Physical Exam Constitutional:      General: She is not in acute distress.    Appearance: Normal appearance. She is not ill-appearing or  toxic-appearing.  Pulmonary:     Effort: Pulmonary effort is normal. No respiratory distress.  Neurological:     Mental Status: She is alert. Mental status is at baseline.  Psychiatric:        Mood and Affect: Mood normal.        Behavior: Behavior normal.      No results found for any visits on 01/11/20.     Assessment & Plan    1. Bronchitis  Symptoms consistent with bronchitis which I have explained is  overwhelmingly due to viral infections and is treated as below.   - predniSONE (DELTASONE) 10 MG tablet; Take 6 pills on day 1, take 5 pills on day 2 and so on until complete.  Dispense: 21 tablet; Refill: 0 - albuterol (VENTOLIN HFA) 108 (90 Base) MCG/ACT inhaler; Inhale 2 puffs into the lungs every 6 (six) hours as needed for wheezing or shortness of breath.  Dispense: 6.7 g; Refill: 1 I discussed the assessment and treatment plan with the patient. The patient was provided an opportunity to ask questions and all were answered. The patient agreed with the plan and demonstrated an understanding of the instructions.   The patient was advised to call back or seek an in-person evaluation if the symptoms worsen or if the condition fails to improve as anticipated.  The entirety of the information documented in the History of Present Illness, Review of Systems and Physical Exam were personally obtained by me. Portions of this information were initially documented by [facility] and reviewed by me for thoroughness and accuracy.      Trinna Post, PA-C  [facility]

## 2020-01-13 ENCOUNTER — Encounter: Payer: Self-pay | Admitting: Physician Assistant

## 2020-01-25 ENCOUNTER — Encounter: Payer: Self-pay | Admitting: Family Medicine

## 2020-01-25 ENCOUNTER — Encounter: Payer: Self-pay | Admitting: Physician Assistant

## 2020-01-27 ENCOUNTER — Telehealth: Payer: Self-pay

## 2020-01-27 NOTE — Telephone Encounter (Signed)
Patient scheduled for appointment.

## 2020-01-30 NOTE — Telephone Encounter (Signed)
Insurance for chiropractic care is hit or miss.  You can call your insurance company and check with them.  Typically when they do cover chiropractic care it does not require referral.  We are also able to see you for this and may be able to provide medication to provide additional relief or exercises or tips to help.  We could also make sure that the IUD is properly positioned as you are having issues with it as well.  Give [place] a call if you want to set up an appointment with [place].

## 2020-02-07 ENCOUNTER — Ambulatory Visit (INDEPENDENT_AMBULATORY_CARE_PROVIDER_SITE_OTHER): Payer: Medicaid Other | Admitting: Family Medicine

## 2020-02-07 ENCOUNTER — Ambulatory Visit: Payer: Self-pay | Admitting: Physician Assistant

## 2020-02-07 ENCOUNTER — Other Ambulatory Visit: Payer: Self-pay

## 2020-02-07 ENCOUNTER — Encounter: Payer: Self-pay | Admitting: Family Medicine

## 2020-02-07 VITALS — BP 133/84 | HR 86 | Temp 97.1°F | Resp 16 | Wt 233.0 lb

## 2020-02-07 DIAGNOSIS — Z30431 Encounter for routine checking of intrauterine contraceptive device: Secondary | ICD-10-CM

## 2020-02-07 DIAGNOSIS — H6123 Impacted cerumen, bilateral: Secondary | ICD-10-CM

## 2020-02-07 DIAGNOSIS — S3992XA Unspecified injury of lower back, initial encounter: Secondary | ICD-10-CM

## 2020-02-07 DIAGNOSIS — E669 Obesity, unspecified: Secondary | ICD-10-CM | POA: Diagnosis not present

## 2020-02-07 DIAGNOSIS — Z6838 Body mass index (BMI) 38.0-38.9, adult: Secondary | ICD-10-CM | POA: Diagnosis not present

## 2020-02-07 MED ORDER — TOPIRAMATE 50 MG PO TABS: ORAL_TABLET | ORAL | 1 refills | Status: DC

## 2020-02-07 NOTE — Patient Instructions (Signed)
Debrox drops   Earwax Buildup, Adult The ears produce a substance called earwax that helps keep bacteria out of the ear and protects the skin in the ear canal. Occasionally, earwax can build up in the ear and cause discomfort or hearing loss. What increases the risk? This condition is more likely to develop in people who:  Are female.  Are elderly.  Naturally produce more earwax.  Clean their ears often with cotton swabs.  Use earplugs often.  Use in-ear headphones often.  Wear hearing aids.  Have narrow ear canals.  Have earwax that is overly thick or sticky.  Have eczema.  Are dehydrated.  Have excess hair in the ear canal. What are the signs or symptoms? Symptoms of this condition include:  Reduced or muffled hearing.  A feeling of fullness in the ear or feeling that the ear is plugged.  Fluid coming from the ear.  Ear pain.  Ear itch.  Ringing in the ear.  Coughing.  An obvious piece of earwax that can be seen inside the ear canal. How is this diagnosed? This condition may be diagnosed based on:  Your symptoms.  Your medical history.  An ear exam. During the exam, your health care provider will look into your ear with an instrument called an otoscope. You may have tests, including a hearing test. How is this treated? This condition may be treated by:  Using ear drops to soften the earwax.  Having the earwax removed by a health care provider. The health care provider may: ? Flush the ear with water. ? Use an instrument that has a loop on the end (curette). ? Use a suction device.  Surgery to remove the wax buildup. This may be done in severe cases. Follow these instructions at home:   Take over-the-counter and prescription medicines only as told by your health care provider.  Do not put any objects, including cotton swabs, into your ear. You can clean the opening of your ear canal with a washcloth or facial tissue.  Follow instructions from  your health care provider about cleaning your ears. Do not over-clean your ears.  Drink enough fluid to keep your urine clear or pale yellow. This will help to thin the earwax.  Keep all follow-up visits as told by your health care provider. If earwax builds up in your ears often or if you use hearing aids, consider seeing your health care provider for routine, preventive ear cleanings. Ask your health care provider how often you should schedule your cleanings.  If you have hearing aids, clean them according to instructions from the manufacturer and your health care provider. Contact a health care provider if:  You have ear pain.  You develop a fever.  You have blood, pus, or other fluid coming from your ear.  You have hearing loss.  You have ringing in your ears that does not go away.  Your symptoms do not improve with treatment.  You feel like the room is spinning (vertigo). Summary  Earwax can build up in the ear and cause discomfort or hearing loss.  The most common symptoms of this condition include reduced or muffled hearing and a feeling of fullness in the ear or feeling that the ear is plugged.  This condition may be diagnosed based on your symptoms, your medical history, and an ear exam.  This condition may be treated by using ear drops to soften the earwax or by having the earwax removed by a health care provider.    Do not put any objects, including cotton swabs, into your ear. You can clean the opening of your ear canal with a washcloth or facial tissue. This information is not intended to replace advice given to you by your health care provider. Make sure you discuss any questions you have with your health care provider. Document Revised: 10/30/2017 Document Reviewed: 01/28/2017 Elsevier Patient Education  2020 [employer]  

## 2020-02-07 NOTE — Progress Notes (Signed)
Patient: [name] Female    DOB: 1991/08/08   28 y.o.   MRN: [id number] Visit Date: 02/07/2020  Today's Provider: Shirlee Latch, MD   Chief Complaint  Patient presents with  . Follow-up  . Ear Problem   Subjective:    I Sulibeya S. Dimas, CMA, am acting as scribe for Shirlee Latch, MD.  HPI Patient here today to have IUD string check. Had bleeding for several weeks, but now resolved.  Patient C/O hearing loss, x's several months. Patient reports having trouble hearing people on the phone.   Patient C/O back pain, patient reports falling on the stairs and landed on her butt. Fall happened about one month. Patient reports that after falling she used heating pad which did help with pain.   Taking topamax for binge eating.  Initially was helping but now not.   No Known Allergies   Current Outpatient Medications:  .  albuterol (VENTOLIN HFA) 108 (90 Base) MCG/ACT inhaler, Inhale 2 puffs into the lungs every 6 (six) hours as needed for wheezing or shortness of breath., Disp: 6.7 g, Rfl: 1 .  esomeprazole (NEXIUM) 20 MG capsule, Take 20 mg by mouth daily at 12 noon., Disp: , Rfl:  .  ketoconazole (NIZORAL) 2 % shampoo, Apply 1 application topically 2 (two) times a week., Disp: 120 mL, Rfl: 0 .  norgestimate-ethinyl estradiol (ORTHO-CYCLEN) 0.25-35 MG-MCG tablet, Take 1 tablet by mouth daily., Disp: 1 Package, Rfl: 11 .  topiramate (TOPAMAX) 50 MG tablet, Take 25 mg before bed x 1 week. Then take 50 mg before bed x 1 week. Then take 75 mg before bed x 1 week. Then take 100 mg before bed x 1wk, Disp: 180 tablet, Rfl: 1 .  clobetasol (TEMOVATE) 0.05 % external solution, Apply 1 application topically 2 (two) times daily. (Patient not taking: Reported on 02/07/2020), Disp: 50 mL, Rfl: 0 .  ibuprofen (ADVIL) 600 MG tablet, Take 1 tablet (600 mg total) by mouth every 6 (six) hours., Disp: 30 tablet, Rfl: 0  Review of Systems  Constitutional: Negative.   HENT: Negative  for ear discharge and ear pain.   Respiratory: Negative.   Cardiovascular: Negative.   Musculoskeletal: Positive for back pain.    Social History   Tobacco Use  . Smoking status: Former Smoker    Quit date: 11/24/2014    Years since quitting: 5.2  . Smokeless tobacco: Never Used  Substance Use Topics  . Alcohol use: No      Objective:   BP 133/84 (BP Location: Right Arm, Patient Position: Sitting, Cuff Size: Normal)   Pulse 86   Temp (!) 97.1 F (36.2 C) (Temporal)   Resp 16   Wt 233 lb (105.7 kg)   BMI 38.77 kg/m  Vitals:   02/07/20 0932  BP: 133/84  Pulse: 86  Resp: 16  Temp: (!) 97.1 F (36.2 C)  TempSrc: Temporal  Weight: 233 lb (105.7 kg)  Body mass index is 38.77 kg/m.   Physical Exam Vitals reviewed.  Constitutional:      General: She is not in acute distress.    Appearance: Normal appearance. She is well-developed. She is not diaphoretic.  HENT:     Head: Normocephalic and atraumatic.     Right Ear: External ear normal. There is impacted cerumen.     Left Ear: External ear normal. There is impacted cerumen.  Eyes:     General: No scleral icterus.    Conjunctiva/sclera: Conjunctivae normal.  Neck:     Thyroid: No thyromegaly.  Cardiovascular:     Rate and Rhythm: Normal rate and regular rhythm.     Pulses: Normal pulses.     Heart sounds: Normal heart sounds. No murmur.  Pulmonary:     Effort: Pulmonary effort is normal. No respiratory distress.     Breath sounds: Normal breath sounds. No wheezing, rhonchi or rales.  Abdominal:     General: There is no distension.     Palpations: Abdomen is soft.     Tenderness: There is no abdominal tenderness.  Genitourinary:    Comments: GYN:  External genitalia within normal limits.  Vaginal mucosa pink, moist, normal rugae.  Nonfriable cervix without lesions, no discharge or bleeding noted on speculum exam.  IUD strings visualized from external os Musculoskeletal:     Cervical back: Neck supple.      Right lower leg: No edema.     Left lower leg: No edema.  Lymphadenopathy:     Cervical: No cervical adenopathy.  Skin:    General: Skin is warm and dry.     Capillary Refill: Capillary refill takes less than 2 seconds.     Findings: No rash.  Neurological:     Mental Status: She is alert and oriented to person, place, and time. Mental status is at baseline.  Psychiatric:        Mood and Affect: Mood normal.        Behavior: Behavior normal.      No results found for any visits on 02/07/20.     Assessment & Plan    1. IUD check up -Patient tolerating IUD well -IUD strings are well visualized -Return precautions discussed  2. Class 2 obesity without serious comorbidity with body mass index (BMI) of 38.0 to 38.9 in adult, unspecified obesity type -Discussed importance of healthy weight management Discussed diet and exercise -Increase Topamax to 100 mg nightly and 50 mg every morning -Discussed possible side effects of Topamax -If she develops any brain fog or word finding difficulty, would decrease her dose again -Follow-up in 6 weeks with PCP  3. Bilateral impacted cerumen -New problem -Likely contributing to her perceived hearing loss -Start treatment with Debrox OTC -If persists, can flush ears in clinic at next visit  4. Injury of coccyx, initial encounter -Exam is benign -Likely some bruising after her fall -Encouraged gentle exercise with walking -Can ice the area as needed -NSAIDs as needed -Return precautions discussed    Meds ordered this encounter  Medications  . topiramate (TOPAMAX) 50 MG tablet    Sig: Take 100mg  qhs, and 50mg  qAM    Dispense:  90 tablet    Refill:  1     Return in about 6 weeks (around 03/20/2020) for chronic disease f/u.   The entirety of the information documented in the History of Present Illness, Review of Systems and Physical Exam were personally obtained by me. Portions of this information were initially documented by  Lynford Humphrey, CMA and reviewed by me for thoroughness and accuracy.    Blaire Hodsdon, Dionne Bucy, MD MPH [facility]

## 2020-03-19 NOTE — Progress Notes (Deleted)
    Established patient visit    Patient: [name]   DOB: 10/15/1991   29 y.o. Female  MRN: [id number] Visit Date: 03/19/2020  Today's healthcare provider: Trey Sailors, PA-C   No chief complaint on file.  Subjective    HPI Weight check  {Show patient history (optional):23778::" "}   Medications: Outpatient Medications Prior to Visit  Medication Sig  . albuterol (VENTOLIN HFA) 108 (90 Base) MCG/ACT inhaler Inhale 2 puffs into the lungs every 6 (six) hours as needed for wheezing or shortness of breath.  . clobetasol (TEMOVATE) 0.05 % external solution Apply 1 application topically 2 (two) times daily. (Patient not taking: Reported on 02/07/2020)  . esomeprazole (NEXIUM) 20 MG capsule Take 20 mg by mouth daily at 12 noon.  Marland Kitchen ibuprofen (ADVIL) 600 MG tablet Take 1 tablet (600 mg total) by mouth every 6 (six) hours.  Marland Kitchen ketoconazole (NIZORAL) 2 % shampoo Apply 1 application topically 2 (two) times a week.  . topiramate (TOPAMAX) 50 MG tablet Take 100mg  qhs, and 50mg  qAM   No facility-administered medications prior to visit.    Review of Systems  {Show previous labs (optional):23779::" "}   Objective    There were no vitals taken for this visit. {Show previous vital signs (optional):23777::" "}  Physical Exam  ***  No results found for any visits on 03/22/20.   Assessment & Plan    ***  No follow-ups on file.      {provider attestation***:1}    [facility] [phone] (phone) [phone] (fax)  [facility]

## 2020-03-22 ENCOUNTER — Ambulatory Visit: Payer: Self-pay | Admitting: Physician Assistant

## 2020-05-17 ENCOUNTER — Other Ambulatory Visit: Payer: Self-pay | Admitting: Physician Assistant

## 2020-05-17 DIAGNOSIS — L210 Seborrhea capitis: Secondary | ICD-10-CM

## 2020-05-17 NOTE — Telephone Encounter (Signed)
Approved per protocal

## 2020-07-12 ENCOUNTER — Other Ambulatory Visit: Payer: Self-pay | Admitting: Family Medicine

## 2020-07-12 NOTE — Telephone Encounter (Signed)
Requested medication (s) are due for refill today - yes  Requested medication (s) are on the active medication list -yes  Future visit scheduled -no  Last refill: 06/15/20  Notes to clinic: Request for non delegated Rx  Requested Prescriptions  Pending Prescriptions Disp Refills   topiramate (TOPAMAX) 50 MG tablet [Pharmacy Med Name: TOPIRAMATE 50 MG TABLET] 90 tablet 1    Sig: TAKE ONE TABLET BY MOUTH EVERY MORNING AND TAKE TWO TABLETS BY MOUTH EVERY NIGHT AT BEDTIME      Not Delegated - Neurology: Anticonvulsants - topiramate & zonisamide Failed - 07/12/2020  2:12 PM      Failed - This refill cannot be delegated      Failed - Cr in normal range and within 360 days    No results found for: CREATININE, LABCREAU, LABCREA, POCCRE        Failed - CO2 in normal range and within 360 days    No results found for: POCCO2, CO2, HCO3        Passed - Valid encounter within last 12 months    Recent Outpatient Visits           5 months ago IUD check up   [facility], Marzella Schlein, [place]   6 months ago Bronchitis   [facility] [place], Ricki Rodriguez M, PA-C   6 months ago Encounter for IUD insertion   [facility] [place], Marzella Schlein, MD   6 months ago Weight loss   [facility] [place], [place], [place]   1 year ago Anxiety   [facility] [place], Adriana M, [place]                  Requested Prescriptions  Pending Prescriptions Disp Refills   topiramate (TOPAMAX) 50 MG tablet [Pharmacy Med Name: TOPIRAMATE 50 MG TABLET] 90 tablet 1    Sig: TAKE ONE TABLET BY MOUTH EVERY MORNING AND TAKE TWO TABLETS BY MOUTH EVERY NIGHT AT BEDTIME      Not Delegated - Neurology: Anticonvulsants - topiramate & zonisamide Failed - 07/12/2020  2:12 PM      Failed - This refill cannot be delegated      Failed - Cr in normal range and within 360 days    No results found for: CREATININE, LABCREAU, LABCREA, POCCRE        Failed  - CO2 in normal range and within 360 days    No results found for: POCCO2, CO2, HCO3        Passed - Valid encounter within last 12 months    Recent Outpatient Visits           5 months ago IUD check up   [facility], Marzella Schlein, MD   6 months ago Bronchitis   [facility] [place], [place], PA-C   6 months ago Encounter for IUD insertion   [facility] [place], Marzella Schlein, MD   6 months ago Weight loss   [facility] [place], Lavella [place], [place]   1 year ago Anxiety   [facility] [place], [place], [place]

## 2020-07-19 ENCOUNTER — Telehealth: Payer: Self-pay

## 2020-07-19 DIAGNOSIS — M545 Low back pain, unspecified: Secondary | ICD-10-CM

## 2020-07-19 NOTE — Telephone Encounter (Signed)
Copied from CRM [id number]. Topic: Referral - Request for Referral >> Jul 19, 2020 12:27 PM Lyn Hollingshead D wrote: Has patient seen PCP for this complaint? no *If NO, is insurance requiring patient see PCP for this issue before PCP can refer them? Yes  Referral for which specialty: Referral to a chiropractor.  Preferred provider/office: N/A Reason for referral: removal of toe ring

## 2020-07-19 NOTE — Telephone Encounter (Signed)
Spoke with patient to advise her that chiropractors do not typically remove toe rings. She reports that she is wanting to see a chiropractor anyway due to an injury she had years ago with her back. And her insurance will allow 5 free visits to go see a chiropractor, but she needs a referral. Advised that an OV would be needed to discuss. Does this need to be an in office visit or can it be virtual? Please advise. Thanks!

## 2020-07-19 NOTE — Telephone Encounter (Signed)
I don't understand how a chiropractor would remove her toe ring? She'll probably need an OV virtual to discuss.

## 2020-07-20 NOTE — Telephone Encounter (Signed)
Referral placed.

## 2020-07-20 NOTE — Addendum Note (Signed)
Addended by: Trey Sailors on: 07/20/2020 04:20 PM   Modules accepted: Orders

## 2020-07-23 NOTE — Telephone Encounter (Signed)
Patient is calling stating the chiropractor will not remove her toe ring for her. Advised on 07/19/20, she was advised that a chiropractor would probably not remove her toe ring. Patient is requesting a referring to someone that can remove her toe ring for her. Please advise when the referral has been placed. CB- [phone]

## 2020-07-23 NOTE — Telephone Encounter (Signed)
Called patient and left voicemail message for patient to call back.pol

## 2020-07-24 NOTE — Telephone Encounter (Signed)
Attempted to reach patient, VM full, unable to leave message

## 2020-08-09 NOTE — Progress Notes (Signed)
Established patient visit   Patient: [name]   DOB: 04-20-1991   29 y.o. Female  MRN: [id number] Visit Date: 08/10/2020  Today's healthcare provider: Trey Sailors, PA-C   Chief Complaint  Patient presents with  . Mass  I,Jeanny Rymer M Shedrick Sarli,acting as a scribe for Trey Sailors, PA-C.,have documented all relevant documentation on the behalf of Trey Sailors, PA-C,as directed by  Trey Sailors, PA-C while in the presence of Trey Sailors, PA-C.  Subjective    HPI   Lump Patient reports she had a fall 6 months ago and had a lump appear on her left buttocks. Patient reports it is disappearing some but is still present and she think it may be a blood clot.   GERD, Follow up:  The patient was last seen for GERD 1 years ago. Changes made since that visit include continue nexium. She is taking nexium before bed and after a meal. She reports the symptoms are worsening, despite what she eats. Denies RUQ pain and tenderness. Reports pain is moderate in her epigrastric region. Denies vomiting.   She reports good compliance with treatment. She is not having side effects. .  She IS experiencing abdominal bloating, belching, chest pain and nocturnal burning. She is NOT experiencing nausea  Wt Readings from Last 3 Encounters:  08/10/20 227 lb 3.2 oz (103.1 kg)  02/07/20 233 lb (105.7 kg)  01/10/20 231 lb (104.8 kg)   Weight Loss  She continues with topamax. Has lost 6 lbs.   Wt Readings from Last 3 Encounters:  08/10/20 227 lb 3.2 oz (103.1 kg)  02/07/20 233 lb (105.7 kg)  01/10/20 231 lb (104.8 kg)    -----------------------------------------------------------------------------------------       Medications: Outpatient Medications Prior to Visit  Medication Sig  . albuterol (VENTOLIN HFA) 108 (90 Base) MCG/ACT inhaler Inhale 2 puffs into the lungs every 6 (six) hours as needed for wheezing or shortness of breath.  . esomeprazole (NEXIUM) 20 MG  capsule Take 20 mg by mouth daily at 12 noon.  Marland Kitchen ibuprofen (ADVIL) 600 MG tablet Take 1 tablet (600 mg total) by mouth every 6 (six) hours.  Marland Kitchen ketoconazole (NIZORAL) 2 % shampoo APPLY TO AFFECTED AREA(S) TWO TIMES A WEEK  . topiramate (TOPAMAX) 50 MG tablet TAKE ONE TABLET BY MOUTH EVERY MORNING AND TAKE TWO TABLETS BY MOUTH EVERY NIGHT AT BEDTIME  . clobetasol (TEMOVATE) 0.05 % external solution Apply 1 application topically 2 (two) times daily. (Patient not taking: Reported on 02/07/2020)   No facility-administered medications prior to visit.    Review of Systems  Constitutional: Negative.   Respiratory: Negative.   Cardiovascular: Negative.   Hematological: Negative.       Objective    BP 132/84 (BP Location: Left Arm, Patient Position: Sitting, Cuff Size: Normal)   Pulse 90   Temp 98.3 F (36.8 C) (Oral)   Wt 227 lb 3.2 oz (103.1 kg)   LMP 08/04/2020 (Exact Date)   SpO2 95%   Breastfeeding No   BMI 37.81 kg/m    Physical Exam Constitutional:      Appearance: Normal appearance. She is obese.  Cardiovascular:     Rate and Rhythm: Normal rate and regular rhythm.     Heart sounds: Normal heart sounds.  Pulmonary:     Effort: Pulmonary effort is normal.     Breath sounds: Normal breath sounds.  Abdominal:     General: Bowel sounds are normal.  Palpations: Abdomen is soft.  Skin:    General: Skin is warm and dry.     Comments: No obvious hematoma noted.  Neurological:     General: No focal deficit present.     Mental Status: She is alert and oriented to person, place, and time.  Psychiatric:        Mood and Affect: Mood normal.        Behavior: Behavior normal.       No results found for any visits on 08/10/20.  Assessment & Plan    1. Hematoma  Counseled about self limiting nature of this.   2. 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 Will add carafate. Counseled on taking nexium 30 min before meals. Counseled on avoiding dietary  triggers.  - sucralfate (CARAFATE) 1 g tablet; Take 1 tablet (1 g total) by mouth 4 (four) times daily -  with meals and at bedtime.  Dispense: 120 tablet; Refill: 0  3. Obesity  Continue Topamax.   Return if symptoms worsen or fail to improve.      ITrey Sailors, PA-C, have reviewed all documentation for this visit. The documentation on 08/10/20 for the exam, diagnosis, procedures, and orders are all accurate and complete.  The entirety of the information documented in the History of Present Illness, Review of Systems and Physical Exam were personally obtained by me. Portions of this information were initially documented by [facility] and reviewed by me for thoroughness and accuracy.     Maryella Shivers  [facility] [phone] (phone) [phone] (fax)  [facility]

## 2020-08-10 ENCOUNTER — Encounter: Payer: Self-pay | Admitting: Physician Assistant

## 2020-08-10 ENCOUNTER — Other Ambulatory Visit: Payer: Self-pay

## 2020-08-10 ENCOUNTER — Ambulatory Visit (INDEPENDENT_AMBULATORY_CARE_PROVIDER_SITE_OTHER): Payer: Medicaid Other | Admitting: Physician Assistant

## 2020-08-10 VITALS — BP 132/84 | HR 90 | Temp 98.3°F | Wt 227.2 lb

## 2020-08-10 DIAGNOSIS — E669 Obesity, unspecified: Secondary | ICD-10-CM

## 2020-08-10 DIAGNOSIS — T148XXA Other injury of unspecified body region, initial encounter: Secondary | ICD-10-CM | POA: Diagnosis not present

## 2020-08-10 DIAGNOSIS — K219 Gastro-esophageal reflux disease without esophagitis: Secondary | ICD-10-CM

## 2020-08-10 DIAGNOSIS — O99619 Diseases of the digestive system complicating pregnancy, unspecified trimester: Secondary | ICD-10-CM | POA: Diagnosis not present

## 2020-08-10 DIAGNOSIS — Z6838 Body mass index (BMI) 38.0-38.9, adult: Secondary | ICD-10-CM | POA: Diagnosis not present

## 2020-08-10 MED ORDER — SUCRALFATE 1 G PO TABS: 1.0000 g | ORAL_TABLET | Freq: Three times a day (TID) | ORAL | 0 refills | Status: DC

## 2020-08-10 NOTE — Patient Instructions (Signed)

## 2020-08-18 ENCOUNTER — Encounter: Payer: Self-pay | Admitting: Physician Assistant

## 2020-08-18 DIAGNOSIS — O99619 Diseases of the digestive system complicating pregnancy, unspecified trimester: Secondary | ICD-10-CM

## 2020-08-18 DIAGNOSIS — K219 Gastro-esophageal reflux disease without esophagitis: Secondary | ICD-10-CM

## 2020-08-18 DIAGNOSIS — R61 Generalized hyperhidrosis: Secondary | ICD-10-CM

## 2020-08-20 DIAGNOSIS — Z20822 Contact with and (suspected) exposure to covid-19: Secondary | ICD-10-CM | POA: Diagnosis not present

## 2020-08-23 ENCOUNTER — Other Ambulatory Visit: Payer: Self-pay | Admitting: Physician Assistant

## 2020-08-23 DIAGNOSIS — R634 Abnormal weight loss: Secondary | ICD-10-CM

## 2020-08-23 MED ORDER — TOPIRAMATE 50 MG PO TABS: ORAL_TABLET | ORAL | 0 refills | Status: DC

## 2020-08-23 NOTE — Telephone Encounter (Signed)
[facility] faxed refill request for the following medications:  topiramate (TOPAMAX) 50 MG tablet  Last Rx: 07/13/2020 LOV: 08/10/2020 Please advise. Thanks TNP

## 2020-08-23 NOTE — Telephone Encounter (Signed)
Please advise 

## 2020-08-24 NOTE — Telephone Encounter (Signed)
Refill sent to patients pharmacy. 

## 2020-08-27 DIAGNOSIS — Z03818 Encounter for observation for suspected exposure to other biological agents ruled out: Secondary | ICD-10-CM | POA: Diagnosis not present

## 2020-08-27 DIAGNOSIS — Z1152 Encounter for screening for COVID-19: Secondary | ICD-10-CM | POA: Diagnosis not present

## 2020-08-28 MED ORDER — DRYSOL 20 % EX SOLN: Freq: Every day | CUTANEOUS | 0 refills | Status: DC

## 2020-09-13 MED ORDER — SUCRALFATE 1 G PO TABS: 1.0000 g | ORAL_TABLET | Freq: Three times a day (TID) | ORAL | 0 refills | Status: DC

## 2020-09-13 NOTE — Addendum Note (Signed)
Addended by: Trey Sailors on: 09/13/2020 04:25 PM   Modules accepted: Orders

## 2020-09-29 ENCOUNTER — Other Ambulatory Visit: Payer: Self-pay | Admitting: Physician Assistant

## 2020-09-29 DIAGNOSIS — R634 Abnormal weight loss: Secondary | ICD-10-CM

## 2020-09-29 NOTE — Telephone Encounter (Signed)
Requested medication (s) are due for refill today: yes  Requested medication (s) are on the active medication list: yes  Last refill:  08/23/20  Future visit scheduled: no  Notes to clinic:  med not delegated to NT to RF- overdue labs   Requested Prescriptions  Pending Prescriptions Disp Refills   topiramate (TOPAMAX) 50 MG tablet [Pharmacy Med Name: TOPIRAMATE 50 MG TABLET] 90 tablet 0    Sig: TAKE ONE TABLET BY MOUTH EVERY MORNING AND TWO  TABLETS EVERY NIGHT AT BEDTIME      Not Delegated - Neurology: Anticonvulsants - topiramate & zonisamide Failed - 09/29/2020  9:48 AM      Failed - This refill cannot be delegated      Failed - Cr in normal range and within 360 days    No results found for: CREATININE, LABCREAU, LABCREA, POCCRE        Failed - CO2 in normal range and within 360 days    No results found for: POCCO2, CO2, HCO3        Passed - Valid encounter within last 12 months    Recent Outpatient Visits           1 month ago Hematoma   [facility] Osvaldo Angst M, PA-C   7 months ago IUD check up   [facility], Marzella Schlein, [place]   8 months ago Bronchitis   [facility] [place], [place], [place]   8 months ago Encounter for IUD insertion   [facility] [place], Marzella Schlein, MD   9 months ago Weight loss   [facility] [place], [place], [place]

## 2020-12-03 ENCOUNTER — Other Ambulatory Visit: Payer: Self-pay | Admitting: Physician Assistant

## 2020-12-03 DIAGNOSIS — L210 Seborrhea capitis: Secondary | ICD-10-CM

## 2020-12-03 NOTE — Telephone Encounter (Signed)
[facility] faxed refill request for the following medications:  ketoconazole (NIZORAL) 2 % shampoo   Please advise.  

## 2020-12-04 MED ORDER — KETOCONAZOLE 2 % EX SHAM: MEDICATED_SHAMPOO | CUTANEOUS | 0 refills | Status: DC

## 2020-12-04 NOTE — Telephone Encounter (Signed)
Please review. Thanks!  

## 2021-01-03 ENCOUNTER — Other Ambulatory Visit: Payer: Self-pay | Admitting: Physician Assistant

## 2021-01-03 DIAGNOSIS — R634 Abnormal weight loss: Secondary | ICD-10-CM

## 2021-01-03 NOTE — Telephone Encounter (Signed)
[facility] faxed refill request for the following medications:  topiramate (TOPAMAX) 50 MG tablet  Last Rx: 10/10/20 LOV: 08/10/20 Please advise. Thanks TNP

## 2021-01-04 MED ORDER — TOPIRAMATE 50 MG PO TABS: ORAL_TABLET | ORAL | 0 refills | Status: DC

## 2021-01-21 ENCOUNTER — Telehealth: Payer: Self-pay

## 2021-01-21 DIAGNOSIS — O99619 Diseases of the digestive system complicating pregnancy, unspecified trimester: Secondary | ICD-10-CM

## 2021-01-21 DIAGNOSIS — H5213 Myopia, bilateral: Secondary | ICD-10-CM | POA: Diagnosis not present

## 2021-01-21 DIAGNOSIS — K219 Gastro-esophageal reflux disease without esophagitis: Secondary | ICD-10-CM

## 2021-01-21 NOTE — Telephone Encounter (Signed)
[facility] faxed refill request for the following medications:  sucralfate (CARAFATE) 1 g tablet   Please advise.  

## 2021-01-22 MED ORDER — SUCRALFATE 1 G PO TABS: 1.0000 g | ORAL_TABLET | Freq: Three times a day (TID) | ORAL | 0 refills | Status: DC

## 2021-01-22 NOTE — Telephone Encounter (Signed)
L.O.V. was on 08/18/2020 and no upcoming appointment. Medication was send into pharmacy.

## 2021-02-04 ENCOUNTER — Other Ambulatory Visit: Payer: Self-pay

## 2021-02-04 DIAGNOSIS — R634 Abnormal weight loss: Secondary | ICD-10-CM

## 2021-02-04 MED ORDER — TOPIRAMATE 50 MG PO TABS: ORAL_TABLET | ORAL | 0 refills | Status: DC

## 2021-02-04 NOTE — Telephone Encounter (Signed)
[facility] faxed refill request for the following medications:  topiramate (TOPAMAX) 50 MG tablet   Please advise.  

## 2021-02-04 NOTE — Telephone Encounter (Signed)
L.O.V. was on 08/10/2020 and no upcoming appointment.

## 2021-05-28 ENCOUNTER — Other Ambulatory Visit: Payer: Self-pay

## 2021-05-28 DIAGNOSIS — O99619 Diseases of the digestive system complicating pregnancy, unspecified trimester: Secondary | ICD-10-CM

## 2021-05-28 MED ORDER — SUCRALFATE 1 G PO TABS: 1.0000 g | ORAL_TABLET | Freq: Three times a day (TID) | ORAL | 0 refills | Status: DC

## 2021-05-28 NOTE — Telephone Encounter (Signed)
[facility] faxed refill request for the following medications:  sucralfate (CARAFATE) 1 g tablet   Please advise.  

## 2021-06-04 ENCOUNTER — Other Ambulatory Visit: Payer: Self-pay

## 2021-06-04 DIAGNOSIS — L210 Seborrhea capitis: Secondary | ICD-10-CM

## 2021-06-04 NOTE — Telephone Encounter (Signed)
[facility] faxed refill request for the following medications:  ketoconazole (NIZORAL) 2 % shampoo   Please advise.

## 2021-06-06 MED ORDER — KETOCONAZOLE 2 % EX SHAM: MEDICATED_SHAMPOO | CUTANEOUS | 0 refills | Status: DC

## 2021-07-05 ENCOUNTER — Ambulatory Visit: Payer: Self-pay | Admitting: *Deleted

## 2021-07-05 DIAGNOSIS — M5412 Radiculopathy, cervical region: Secondary | ICD-10-CM | POA: Diagnosis not present

## 2021-07-05 DIAGNOSIS — M79601 Pain in right arm: Secondary | ICD-10-CM | POA: Diagnosis not present

## 2021-07-05 NOTE — Telephone Encounter (Signed)
Per agent: " "Patient would like to be contacted regarding a sudden irritation and discomfort they're feeling in their right arm towards their shoulder   Patient shares that they are unable to stretch or move their arm   The discomfort began in the afternoon of 07/05/21 "      Able to reach pt. States "It got so bad I went to an UC care, I'm there now." Advised practice would follow up.Advised to CB if needed.

## 2021-07-08 DIAGNOSIS — M79601 Pain in right arm: Secondary | ICD-10-CM | POA: Diagnosis not present

## 2021-07-08 DIAGNOSIS — M25511 Pain in right shoulder: Secondary | ICD-10-CM | POA: Diagnosis not present

## 2021-08-04 ENCOUNTER — Other Ambulatory Visit: Payer: Self-pay | Admitting: Family Medicine

## 2021-08-04 DIAGNOSIS — K219 Gastro-esophageal reflux disease without esophagitis: Secondary | ICD-10-CM

## 2021-08-04 NOTE — Telephone Encounter (Signed)
Requested Prescriptions  Pending Prescriptions Disp Refills  . sucralfate (CARAFATE) 1 g tablet [Pharmacy Med Name: SUCRALFATE 1 GM TABLET] 120 tablet 0    Sig: TAKE ONE TABLET BY MOUTH FOUR TIMES A DAY WITH MEALS AND AT BEDTIME**WILL NEED FOLLOW-UP APPOINTMENT BEFORE FURTHER REFILLS     Gastroenterology: Antiacids Passed - 08/04/2021 11:06 AM      Passed - Valid encounter within last 12 months    Recent Outpatient Visits          11 months ago Hematoma   [facility] Trey Sailors, PA-C   1 year ago IUD check up   [facility], Marzella Schlein, [place]   1 year ago Bronchitis   [facility] Trey Sailors, [place]   1 year ago Encounter for IUD insertion   [facility], Marzella Schlein, [place]   1 year ago Weight loss   [facility] [place], [place], [place]

## 2021-09-02 ENCOUNTER — Other Ambulatory Visit: Payer: Self-pay | Admitting: Family Medicine

## 2021-09-02 DIAGNOSIS — K219 Gastro-esophageal reflux disease without esophagitis: Secondary | ICD-10-CM

## 2021-09-03 NOTE — Telephone Encounter (Signed)
Requested medications are due for refill today yes  Requested medications are on the active medication list yes  Last refill 08/04/21  Last visit 08/10/20  Future visit scheduled no  Notes to clinic Has already had a curtesy refill and there is no upcoming appointment scheduled.

## 2021-10-03 ENCOUNTER — Ambulatory Visit (INDEPENDENT_AMBULATORY_CARE_PROVIDER_SITE_OTHER): Payer: Medicaid Other | Admitting: Family Medicine

## 2021-10-03 ENCOUNTER — Encounter: Payer: Self-pay | Admitting: Family Medicine

## 2021-10-03 ENCOUNTER — Other Ambulatory Visit: Payer: Self-pay

## 2021-10-03 VITALS — BP 140/100 | HR 92 | Resp 16 | Wt 257.3 lb

## 2021-10-03 DIAGNOSIS — Z136 Encounter for screening for cardiovascular disorders: Secondary | ICD-10-CM | POA: Diagnosis not present

## 2021-10-03 DIAGNOSIS — E66813 Obesity, class 3: Secondary | ICD-10-CM | POA: Insufficient documentation

## 2021-10-03 DIAGNOSIS — M546 Pain in thoracic spine: Secondary | ICD-10-CM | POA: Diagnosis not present

## 2021-10-03 DIAGNOSIS — Z13220 Encounter for screening for lipoid disorders: Secondary | ICD-10-CM | POA: Diagnosis not present

## 2021-10-03 DIAGNOSIS — R635 Abnormal weight gain: Secondary | ICD-10-CM | POA: Diagnosis not present

## 2021-10-03 DIAGNOSIS — L409 Psoriasis, unspecified: Secondary | ICD-10-CM | POA: Diagnosis not present

## 2021-10-03 DIAGNOSIS — Z131 Encounter for screening for diabetes mellitus: Secondary | ICD-10-CM | POA: Diagnosis not present

## 2021-10-03 DIAGNOSIS — Z6841 Body Mass Index (BMI) 40.0 and over, adult: Secondary | ICD-10-CM

## 2021-10-03 DIAGNOSIS — E663 Overweight: Secondary | ICD-10-CM | POA: Diagnosis not present

## 2021-10-03 DIAGNOSIS — K219 Gastro-esophageal reflux disease without esophagitis: Secondary | ICD-10-CM

## 2021-10-03 MED ORDER — METAXALONE 800 MG PO TABS: 800.0000 mg | ORAL_TABLET | Freq: Three times a day (TID) | ORAL | 0 refills | Status: DC | PRN

## 2021-10-03 MED ORDER — PREDNISONE 20 MG PO TABS: 20.0000 mg | ORAL_TABLET | Freq: Every day | ORAL | 0 refills | Status: DC

## 2021-10-03 MED ORDER — BACLOFEN 20 MG PO TABS: 20.0000 mg | ORAL_TABLET | Freq: Three times a day (TID) | ORAL | 0 refills | Status: DC

## 2021-10-03 MED ORDER — MELOXICAM 15 MG PO TABS: 15.0000 mg | ORAL_TABLET | Freq: Every day | ORAL | 0 refills | Status: DC

## 2021-10-03 MED ORDER — CLOBETASOL PROPIONATE 0.05 % EX SOLN: 1.0000 "application " | Freq: Two times a day (BID) | CUTANEOUS | 0 refills | Status: DC

## 2021-10-03 NOTE — Assessment & Plan Note (Signed)
Check a1c today given obesity and weight gain/fatigue

## 2021-10-03 NOTE — Assessment & Plan Note (Signed)
BMI 42 

## 2021-10-03 NOTE — Assessment & Plan Note (Signed)
Chronic, unstable Taking multiple agents- both daily and as needed Suggest dietary journal and bland diet Will f/u in 4 wks- recommend bring journal to address trigger foods

## 2021-10-03 NOTE — Assessment & Plan Note (Signed)
Reports working out 3 days/week- however, 30# weight gain in 1.5 years Frustrated Reports hectic lifestyle with work, marriage, two kids

## 2021-10-03 NOTE — Assessment & Plan Note (Signed)
Family hx of cardiac disorders Lipid panel pending

## 2021-10-03 NOTE — Assessment & Plan Note (Signed)
Pulled for refill 

## 2021-10-03 NOTE — Assessment & Plan Note (Signed)
BMI 42 Discussed importance of healthy weight management Discussed diet and exercise

## 2021-10-03 NOTE — Assessment & Plan Note (Signed)
Recent UC for R arm pain with weights Has since decreased weight to smaller lb size Some strain noted in R arm/neck since Back/neck pain noted with long seated work Has tried OTC medication, 'leftover' medication, restrictive collars, and heat Minor relief in am but worsens as day goes on Trial of short burst steroid, muscle relaxants for am/pm use, as well as Mobic

## 2021-10-03 NOTE — Progress Notes (Signed)
Established patient visit   Patient: [name]   DOB: 1991-10-12   30 y.o. Female  MRN: [id number] Visit Date: 10/03/2021  Today's healthcare provider: Jacky Kindle, FNP   Chief Complaint  Patient presents with   Neck Pain   Subjective    Neck Pain  This is a recurrent problem. The current episode started more than 1 month ago (patient states she was seen at Urgent Care and reports pinched nerve in neck). The problem has been unchanged. The pain is associated with a twisting injury. The pain is present in the midline. The quality of the pain is described as shooting. The symptoms are aggravated by position. The pain is Same all the time (radiating down to right shoulder). Pertinent negatives include no chest pain, fever, headaches, leg pain, numbness, pain with swallowing, paresis, syncope, tingling, trouble swallowing, visual change, weakness or weight loss. She has tried muscle relaxants and oral narcotics for the symptoms. The treatment provided moderate relief.       Medications: Outpatient Medications Prior to Visit  Medication Sig   albuterol (VENTOLIN HFA) 108 (90 Base) MCG/ACT inhaler Inhale 2 puffs into the lungs every 6 (six) hours as needed for wheezing or shortness of breath.   aluminum chloride (DRYSOL) 20 % external solution Apply topically at bedtime.   esomeprazole (NEXIUM) 20 MG capsule Take 20 mg by mouth daily at 12 noon.   ketoconazole (NIZORAL) 2 % shampoo Apply topically 2 (two) times a week.   sucralfate (CARAFATE) 1 g tablet TAKE ONE TABLET BY MOUTH FOUR TIMES A DAY WITH MEALS AND AT BEDTIME**WILL NEED FOLLOW-UP APPOINTMENT BEFORE FURTHER REFILLS   [DISCONTINUED] ibuprofen (ADVIL) 600 MG tablet Take 1 tablet (600 mg total) by mouth every 6 (six) hours.   [DISCONTINUED] topiramate (TOPAMAX) 50 MG tablet TAKE ONE TABLET BY MOUTH EVERY MORNING AND TWO  TABLETS EVERY NIGHT AT BEDTIME   [DISCONTINUED] clobetasol (TEMOVATE) 0.05 % external solution  Apply 1 application topically 2 (two) times daily. (Patient not taking: No sig reported)   No facility-administered medications prior to visit.    Review of Systems  Constitutional:  Negative for fever and weight loss.  HENT:  Negative for trouble swallowing.   Cardiovascular:  Negative for chest pain and syncope.  Musculoskeletal:  Positive for neck pain.  Neurological:  Negative for tingling, weakness, numbness and headaches.      Objective    BP (!) 140/100   Pulse 92   Resp 16   Wt 257 lb 4.8 oz (116.7 kg)   SpO2 93%   BMI 42.82 kg/m    Physical Exam Vitals and nursing note reviewed.  Constitutional:      General: She is not in acute distress.    Appearance: Normal appearance. She is obese. She is not ill-appearing, toxic-appearing or diaphoretic.  HENT:     Head: Normocephalic and atraumatic.  Cardiovascular:     Rate and Rhythm: Normal rate and regular rhythm.     Pulses: Normal pulses.     Heart sounds: Normal heart sounds. No murmur heard.   No friction rub. No gallop.  Pulmonary:     Effort: Pulmonary effort is normal. No respiratory distress.     Breath sounds: Normal breath sounds. No stridor. No wheezing, rhonchi or rales.  Chest:     Chest wall: No tenderness.  Abdominal:     General: Bowel sounds are normal.     Palpations: Abdomen is soft.  Musculoskeletal:  General: Tenderness present. No swelling, deformity or signs of injury. Normal range of motion.     Right lower leg: No edema.     Left lower leg: No edema.  Skin:    General: Skin is warm and dry.     Capillary Refill: Capillary refill takes less than 2 seconds.     Coloration: Skin is not jaundiced or pale.     Findings: No bruising, erythema, lesion or rash.       Neurological:     General: No focal deficit present.     Mental Status: She is alert and oriented to person, place, and time. Mental status is at baseline.     Cranial Nerves: No cranial nerve deficit.     Sensory: No  sensory deficit.     Motor: No weakness.     Coordination: Coordination normal.  Psychiatric:        Mood and Affect: Mood normal.        Behavior: Behavior normal.        Thought Content: Thought content normal.        Judgment: Judgment normal.     No results found for any visits on 10/03/21.  Assessment & Plan     Problem List Items Addressed This Visit       Digestive   Gastroesophageal reflux disease without esophagitis    Chronic, unstable Taking multiple agents- both daily and as needed Suggest dietary journal and bland diet Will f/u in 4 wks- recommend bring journal to address trigger foods      Relevant Orders   Hepatic function panel     Musculoskeletal and Integument   Psoriasis    Pulled for refill      Relevant Medications   clobetasol (TEMOVATE) 0.05 % external solution     Other   Overweight   Relevant Orders   Basic metabolic panel   CBC with Differential/Platelet   Acute midline thoracic back pain - Primary    Recent UC for R arm pain with weights Has since decreased weight to smaller lb size Some strain noted in R arm/neck since Back/neck pain noted with long seated work Has tried OTC medication, 'leftover' medication, restrictive collars, and heat Minor relief in am but worsens as day goes on Trial of short burst steroid, muscle relaxants for am/pm use, as well as Mobic      Relevant Medications   metaxalone (SKELAXIN) 800 MG tablet   baclofen (LIORESAL) 20 MG tablet   meloxicam (MOBIC) 15 MG tablet   predniSONE (DELTASONE) 20 MG tablet   BMI 40.0-44.9, adult (HCC)    BMI 42      Class 3 severe obesity due to excess calories without serious comorbidity with body mass index (BMI) of 40.0 to 44.9 in adult (HCC)    BMI 42 Discussed importance of healthy weight management Discussed diet and exercise       Weight gain    Reports working out 3 days/week- however, 30# weight gain in 1.5 years Frustrated Reports hectic lifestyle with  work, marriage, two kids      Relevant Orders   T4, free   TSH   Screening for diabetes mellitus    Check a1c today given obesity and weight gain/fatigue      Relevant Orders   Hemoglobin A1c   Encounter for lipid screening for cardiovascular disease    Family hx of cardiac disorders Lipid panel pending      Relevant Orders   Lipid  panel     Return in about 4 weeks (around 10/31/2021).      Leilani Merl, FNP, have reviewed all documentation for this visit. The documentation on 10/03/21 for the exam, diagnosis, procedures, and orders are all accurate and complete.    Jacky Kindle, FNP  [facility] [phone] (phone) [phone] (fax)  [facility]

## 2021-10-04 ENCOUNTER — Other Ambulatory Visit: Payer: Self-pay | Admitting: Family Medicine

## 2021-10-04 DIAGNOSIS — R7303 Prediabetes: Secondary | ICD-10-CM

## 2021-10-04 DIAGNOSIS — Z6841 Body Mass Index (BMI) 40.0 and over, adult: Secondary | ICD-10-CM

## 2021-10-04 DIAGNOSIS — R635 Abnormal weight gain: Secondary | ICD-10-CM

## 2021-10-04 LAB — BASIC METABOLIC PANEL
BUN/Creatinine Ratio: 19 (ref 9–23)
BUN: 15 mg/dL (ref 6–20)
CO2: 23 mmol/L (ref 20–29)
Calcium: 9.6 mg/dL (ref 8.7–10.2)
Chloride: 102 mmol/L (ref 96–106)
Creatinine, Ser: 0.81 mg/dL (ref 0.57–1.00)
Glucose: 91 mg/dL (ref 70–99)
Potassium: 4.7 mmol/L (ref 3.5–5.2)
Sodium: 140 mmol/L (ref 134–144)
eGFR: 99 mL/min/{1.73_m2} (ref >59)

## 2021-10-04 LAB — CBC WITH DIFFERENTIAL/PLATELET
Basophils Absolute: 0.1 10*3/uL (ref 0.0–0.2)
Basos: 1 %
EOS (ABSOLUTE): 0.3 10*3/uL (ref 0.0–0.4)
Eos: 3 %
Hematocrit: 40.3 % (ref 34.0–46.6)
Hemoglobin: 13.5 g/dL (ref 11.1–15.9)
Immature Grans (Abs): 0 10*3/uL (ref 0.0–0.1)
Immature Granulocytes: 0 %
Lymphocytes Absolute: 2.9 10*3/uL (ref 0.7–3.1)
Lymphs: 28 %
MCH: 28.2 pg (ref 26.6–33.0)
MCHC: 33.5 g/dL (ref 31.5–35.7)
MCV: 84 fL (ref 79–97)
Monocytes Absolute: 0.7 10*3/uL (ref 0.1–0.9)
Monocytes: 7 %
Neutrophils Absolute: 6.3 10*3/uL (ref 1.4–7.0)
Neutrophils: 61 %
Platelets: 307 10*3/uL (ref 150–450)
RBC: 4.79 x10E6/uL (ref 3.77–5.28)
RDW: 12.3 % (ref 11.7–15.4)
WBC: 10.3 10*3/uL (ref 3.4–10.8)

## 2021-10-04 LAB — T4, FREE: Free T4: 1.21 ng/dL (ref 0.82–1.77)

## 2021-10-04 LAB — HEPATIC FUNCTION PANEL
ALT: 23 IU/L (ref 0–32)
AST: 18 IU/L (ref 0–40)
Albumin: 4.3 g/dL (ref 3.8–4.8)
Alkaline Phosphatase: 71 IU/L (ref 44–121)
Bilirubin Total: 0.3 mg/dL (ref 0.0–1.2)
Bilirubin, Direct: <0.1 mg/dL (ref 0.00–0.40)
Total Protein: 6.9 g/dL (ref 6.0–8.5)

## 2021-10-04 LAB — LIPID PANEL
Chol/HDL Ratio: 4.2 ratio (ref 0.0–4.4)
Cholesterol, Total: 176 mg/dL (ref 100–199)
HDL: 42 mg/dL (ref >39)
LDL Chol Calc (NIH): 114 mg/dL — ABNORMAL HIGH (ref 0–99)
Triglycerides: 109 mg/dL (ref 0–149)
VLDL Cholesterol Cal: 20 mg/dL (ref 5–40)

## 2021-10-04 LAB — HEMOGLOBIN A1C
Est. average glucose Bld gHb Est-mCnc: 117 mg/dL
Hgb A1c MFr Bld: 5.7 % — ABNORMAL HIGH (ref 4.8–5.6)

## 2021-10-04 LAB — TSH: TSH: 3.27 u[IU]/mL (ref 0.450–4.500)

## 2021-10-04 MED ORDER — SEMAGLUTIDE(0.25 OR 0.5MG/DOS) 2 MG/1.5ML ~~LOC~~ SOPN: 0.2500 mg | PEN_INJECTOR | SUBCUTANEOUS | 0 refills | Status: DC

## 2021-10-16 ENCOUNTER — Encounter: Payer: Self-pay | Admitting: Family Medicine

## 2021-10-21 ENCOUNTER — Other Ambulatory Visit: Payer: Self-pay | Admitting: Family Medicine

## 2021-10-21 DIAGNOSIS — R7303 Prediabetes: Secondary | ICD-10-CM

## 2021-10-21 DIAGNOSIS — Z6841 Body Mass Index (BMI) 40.0 and over, adult: Secondary | ICD-10-CM

## 2021-10-21 MED ORDER — SEMAGLUTIDE (1 MG/DOSE) 4 MG/3ML ~~LOC~~ SOPN: 1.0000 mg | PEN_INJECTOR | SUBCUTANEOUS | 2 refills | Status: DC

## 2021-10-21 NOTE — Progress Notes (Signed)
o

## 2021-10-23 ENCOUNTER — Telehealth: Payer: Self-pay | Admitting: Family Medicine

## 2021-10-23 DIAGNOSIS — K219 Gastro-esophageal reflux disease without esophagitis: Secondary | ICD-10-CM

## 2021-10-23 MED ORDER — SUCRALFATE 1 G PO TABS: ORAL_TABLET | ORAL | 0 refills | Status: DC

## 2021-10-23 NOTE — Telephone Encounter (Signed)
[facility] faxed refill request for the following medications:  sucralfate (CARAFATE) 1 g tablet   Please advise.

## 2021-10-31 ENCOUNTER — Ambulatory Visit: Payer: Medicaid Other | Admitting: Family Medicine

## 2021-11-13 ENCOUNTER — Other Ambulatory Visit: Payer: Self-pay | Admitting: Family Medicine

## 2021-11-14 NOTE — Telephone Encounter (Signed)
LOV:  10/03/2021  NOV: None  Last Refill:   metaxalone 10/03/2021 #30 0 Refills Meloxicam 10/03/2021 #30 0 Refills   Thanks,   -Vernona Rieger

## 2021-11-14 NOTE — Telephone Encounter (Signed)
Requested medication (s) are due for refill today:   Yes for both  Requested medication (s) are on the active medication list:   Yes for both  Future visit scheduled:   No Seen a month ago   Last ordered: 10/03/2021 #30, 0 refills for both  Returned because of being a non delegated refill.   Requested Prescriptions  Pending Prescriptions Disp Refills   meloxicam (MOBIC) 15 MG tablet [Pharmacy Med Name: MELOXICAM 15 MG TABLET] 30 tablet 0    Sig: TAKE ONE TABLET BY MOUTH DAILY     Analgesics:  COX2 Inhibitors Passed - 11/13/2021  5:29 PM      Passed - HGB in normal range and within 360 days    Hemoglobin  Date Value Ref Range Status  10/03/2021 13.5 11.1 - 15.9 g/dL Final          Passed - Cr in normal range and within 360 days    Creatinine, Ser  Date Value Ref Range Status  10/03/2021 0.81 0.57 - 1.00 mg/dL Final          Passed - Patient is not pregnant      Passed - Valid encounter within last 12 months    Recent Outpatient Visits           1 month ago Acute midline thoracic back pain   [facility] Jacky Kindle, FNP   1 year ago Hematoma   [facility] Trey Sailors, [place]   1 year ago IUD check up   [facility], Marzella Schlein, [place]   1 year ago Bronchitis   [facility], Lavella Hammock, [place]   1 year ago Encounter for IUD insertion   [facility], Marzella Schlein, [place]               metaxalone (SKELAXIN) 800 MG tablet [Pharmacy Med Name: METAXALONE 800 MG TABLET] 30 tablet 0    Sig: TAKE ONE TABLET BY MOUTH THREE TIMES A DAY AS NEEDED FOR MUSCLE SPASMS     Not Delegated - Analgesics:  Muscle Relaxants Failed - 11/13/2021  5:29 PM      Failed - This refill cannot be delegated      Passed - Valid encounter within last 6 months    Recent Outpatient Visits           1 month ago Acute midline thoracic back pain   [facility] Jacky Kindle, FNP   1 year  ago Hematoma   [facility] Trey Sailors, [place]   1 year ago IUD check up   [facility], Marzella Schlein, [place]   1 year ago Bronchitis   [facility], Lavella Hammock, [place]   1 year ago Encounter for IUD insertion   [facility], Marzella Schlein, [place]

## 2021-12-24 ENCOUNTER — Other Ambulatory Visit: Payer: Self-pay | Admitting: Family Medicine

## 2021-12-24 DIAGNOSIS — O99619 Diseases of the digestive system complicating pregnancy, unspecified trimester: Secondary | ICD-10-CM

## 2021-12-25 NOTE — Telephone Encounter (Signed)
°  Requested medication (s) are due for refill today: yes  Requested medication (s) are on the active medication list: yes  Last refill:  10/23/21  Future visit scheduled: no, was asked to return in 4 weeks, 12/1, pt canceled appt, no upcoming appt scheduled.  Notes to clinic:  Please assess. OV note stated unstable GERD, return in 4 weeks. Please assess.  Requested Prescriptions  Pending Prescriptions Disp Refills   sucralfate (CARAFATE) 1 g tablet [Pharmacy Med Name: SUCRALFATE 1 GM TABLET] 120 tablet 0    Sig: TAKE ONE TABLET BY MOUTH FOUR TIMES A DAY WITH MEALS AND AT BEDTIME     Gastroenterology: Antiacids Passed - 12/24/2021  9:01 PM      Passed - Valid encounter within last 12 months    Recent Outpatient Visits           2 months ago Acute midline thoracic back pain   [facility] Jacky Kindle, FNP   1 year ago Hematoma   [facility] Trey Sailors, [place]   1 year ago IUD check up   [facility], Marzella Schlein, [place]   1 year ago Bronchitis   [facility], Lavella Hammock, [place]   1 year ago Encounter for IUD insertion   [facility], Marzella Schlein, [place]

## 2021-12-30 ENCOUNTER — Other Ambulatory Visit: Payer: Self-pay | Admitting: Family Medicine

## 2021-12-30 DIAGNOSIS — K219 Gastro-esophageal reflux disease without esophagitis: Secondary | ICD-10-CM

## 2021-12-31 MED ORDER — SUCRALFATE 1 G PO TABS: ORAL_TABLET | ORAL | 0 refills | Status: DC

## 2022-03-24 ENCOUNTER — Other Ambulatory Visit: Payer: Self-pay | Admitting: Family Medicine

## 2022-03-24 DIAGNOSIS — K219 Gastro-esophageal reflux disease without esophagitis: Secondary | ICD-10-CM

## 2022-05-25 ENCOUNTER — Other Ambulatory Visit: Payer: Self-pay | Admitting: Family Medicine

## 2022-05-25 DIAGNOSIS — O99619 Diseases of the digestive system complicating pregnancy, unspecified trimester: Secondary | ICD-10-CM

## 2022-06-05 ENCOUNTER — Other Ambulatory Visit: Payer: Self-pay | Admitting: Family Medicine

## 2022-06-05 NOTE — Telephone Encounter (Signed)
Requested medications are due for refill today.  Unsure  Requested medications are on the active medications list.  yes  Last refill. 11/15/2021 #30 0 refills  Future visit scheduled.   no  Notes to clinic.  Refill not delegated    Requested Prescriptions  Pending Prescriptions Disp Refills   metaxalone (SKELAXIN) 800 MG tablet [Pharmacy Med Name: METAXALONE 800 MG TABLET] 30 tablet 0    Sig: TAKE ONE TABLET BY MOUTH THREE TIMES A DAY AS NEEDED FOR MUSCLE SPASMS     Not Delegated - Analgesics:  Muscle Relaxants Failed - 06/05/2022 10:54 AM      Failed - This refill cannot be delegated      Failed - Valid encounter within last 6 months    Recent Outpatient Visits           8 months ago Acute midline thoracic back pain   [facility] Gwyneth Sprout, FNP   1 year ago Hematoma   [facility] Carles Collet M, [place]   2 years ago IUD check up   [facility], Dionne Bucy, [place]   2 years ago [place], Wendee Beavers, [place]   2 years ago Encounter for IUD insertion   [facility], Dionne Bucy, [place]              Signed Prescriptions Disp Refills   meloxicam (MOBIC) 15 MG tablet 30 tablet 1    Sig: TAKE ONE TABLET BY MOUTH DAILY     Analgesics:  COX2 Inhibitors Failed - 06/05/2022 10:54 AM      Failed - Manual Review: Labs are only required if the patient has taken medication for more than 8 weeks.      Passed - HGB in normal range and within 360 days    Hemoglobin  Date Value Ref Range Status  10/03/2021 13.5 11.1 - 15.9 g/dL Final         Passed - Cr in normal range and within 360 days    Creatinine, Ser  Date Value Ref Range Status  10/03/2021 0.81 0.57 - 1.00 mg/dL Final         Passed - HCT in normal range and within 360 days    Hematocrit  Date Value Ref Range Status  10/03/2021 40.3 34.0 - 46.6 % Final         Passed - AST in normal range and within 360 days     AST  Date Value Ref Range Status  10/03/2021 18 0 - 40 IU/L Final         Passed - ALT in normal range and within 360 days    ALT  Date Value Ref Range Status  10/03/2021 23 0 - 32 IU/L Final         Passed - eGFR is 30 or above and within 360 days    eGFR  Date Value Ref Range Status  10/03/2021 99 >59 mL/min/1.73 Final         Passed - Patient is not pregnant      Passed - Valid encounter within last 12 months    Recent Outpatient Visits           8 months ago Acute midline thoracic back pain   [facility] Gwyneth Sprout, FNP   1 year ago Hematoma   [facility] Carles Collet M, [place]   2 years ago IUD check up   [facility], Dionne Bucy, [place]   2 years ago [place], Wendee Beavers, [place]   2 years ago Encounter for IUD insertion   [facility], Dionne Bucy, [place]

## 2022-06-05 NOTE — Telephone Encounter (Signed)
Requested Prescriptions  Pending Prescriptions Disp Refills  . meloxicam (MOBIC) 15 MG tablet [Pharmacy Med Name: MELOXICAM 15 MG TABLET] 30 tablet 1    Sig: TAKE ONE TABLET BY MOUTH DAILY     Analgesics:  COX2 Inhibitors Failed - 06/05/2022 10:54 AM      Failed - Manual Review: Labs are only required if the patient has taken medication for more than 8 weeks.      Passed - HGB in normal range and within 360 days    Hemoglobin  Date Value Ref Range Status  10/03/2021 13.5 11.1 - 15.9 g/dL Final         Passed - Cr in normal range and within 360 days    Creatinine, Ser  Date Value Ref Range Status  10/03/2021 0.81 0.57 - 1.00 mg/dL Final         Passed - HCT in normal range and within 360 days    Hematocrit  Date Value Ref Range Status  10/03/2021 40.3 34.0 - 46.6 % Final         Passed - AST in normal range and within 360 days    AST  Date Value Ref Range Status  10/03/2021 18 0 - 40 IU/L Final         Passed - ALT in normal range and within 360 days    ALT  Date Value Ref Range Status  10/03/2021 23 0 - 32 IU/L Final         Passed - eGFR is 30 or above and within 360 days    eGFR  Date Value Ref Range Status  10/03/2021 99 >59 mL/min/1.73 Final         Passed - Patient is not pregnant      Passed - Valid encounter within last 12 months    Recent Outpatient Visits          8 months ago Acute midline thoracic back pain   [facility] Gwyneth Sprout, FNP   1 year ago Hematoma   [facility] Carles Collet M, [place]   2 years ago IUD check up   [facility], Dionne Bucy, [place]   2 years ago [place], Wendee Beavers, [place]   2 years ago Encounter for IUD insertion   [facility], Dionne Bucy, [place]             . metaxalone (SKELAXIN) 800 MG tablet [Pharmacy Med Name: METAXALONE 800 MG TABLET] 30 tablet 0    Sig: TAKE ONE TABLET BY MOUTH THREE TIMES A DAY AS  NEEDED FOR MUSCLE SPASMS     Not Delegated - Analgesics:  Muscle Relaxants Failed - 06/05/2022 10:54 AM      Failed - This refill cannot be delegated      Failed - Valid encounter within last 6 months    Recent Outpatient Visits          8 months ago Acute midline thoracic back pain   [facility] Gwyneth Sprout, FNP   1 year ago Hematoma   [facility] Carles Collet M, [place]   2 years ago IUD check up   [facility], Dionne Bucy, [place]   2 years ago [place], Wendee Beavers, [place]   2 years ago Encounter for IUD insertion   [facility], Dionne Bucy, MD

## 2022-07-21 ENCOUNTER — Other Ambulatory Visit: Payer: Self-pay | Admitting: Family Medicine

## 2022-07-21 DIAGNOSIS — K219 Gastro-esophageal reflux disease without esophagitis: Secondary | ICD-10-CM

## 2022-07-21 NOTE — Telephone Encounter (Signed)
[facility] faxed refill request for the following medications:  sucralfate (CARAFATE) 1 g tablet   Please advise.

## 2022-07-26 ENCOUNTER — Other Ambulatory Visit: Payer: Self-pay | Admitting: Family Medicine

## 2022-07-26 DIAGNOSIS — K219 Gastro-esophageal reflux disease without esophagitis: Secondary | ICD-10-CM

## 2022-07-26 DIAGNOSIS — O99619 Diseases of the digestive system complicating pregnancy, unspecified trimester: Secondary | ICD-10-CM

## 2022-07-28 ENCOUNTER — Other Ambulatory Visit: Payer: Self-pay

## 2022-07-28 DIAGNOSIS — K219 Gastro-esophageal reflux disease without esophagitis: Secondary | ICD-10-CM

## 2022-07-28 MED ORDER — SUCRALFATE 1 G PO TABS: ORAL_TABLET | ORAL | 0 refills | Status: DC

## 2022-07-28 NOTE — Telephone Encounter (Signed)
Pharmacy: [facility] is calling- requesting resend Rx with instructions - they did not get instruction- just notes that patient needs appointment

## 2022-07-28 NOTE — Telephone Encounter (Signed)
It's been resent.

## 2022-10-07 ENCOUNTER — Other Ambulatory Visit: Payer: Self-pay | Admitting: Family Medicine

## 2022-10-07 DIAGNOSIS — K219 Gastro-esophageal reflux disease without esophagitis: Secondary | ICD-10-CM

## 2022-10-07 MED ORDER — SUCRALFATE 1 G PO TABS: ORAL_TABLET | ORAL | 0 refills | Status: DC

## 2022-10-09 ENCOUNTER — Ambulatory Visit: Payer: Medicaid Other | Admitting: Family Medicine

## 2022-10-09 NOTE — Progress Notes (Deleted)
      Established patient visit   Patient: [name]   DOB: 09-14-1991   31 y.o. Female  MRN: [id number] Visit Date: 10/09/2022  Today's healthcare provider: Jacky Kindle, FNP   No chief complaint on file.  Subjective    HPI  Follow up for GERD  The patient was last seen for this 1 years ago. Changes made at last visit include no changes.  She reports {excellent/good/fair/poor:19665} compliance with treatment. She feels that condition is {improved/worse/unchanged:3041574}. She {is/is not:21021397} having side effects. ***  -----------------------------------------------------------------------------------------   Medications: Outpatient Medications Prior to Visit  Medication Sig   albuterol (VENTOLIN HFA) 108 (90 Base) MCG/ACT inhaler Inhale 2 puffs into the lungs every 6 (six) hours as needed for wheezing or shortness of breath.   aluminum chloride (DRYSOL) 20 % external solution Apply topically at bedtime.   clobetasol (TEMOVATE) 0.05 % external solution Apply 1 application topically 2 (two) times daily.   esomeprazole (NEXIUM) 20 MG capsule Take 20 mg by mouth daily at 12 noon.   ketoconazole (NIZORAL) 2 % shampoo Apply topically 2 (two) times a week.   meloxicam (MOBIC) 15 MG tablet TAKE ONE TABLET BY MOUTH DAILY   metaxalone (SKELAXIN) 800 MG tablet TAKE ONE TABLET BY MOUTH THREE TIMES A DAY AS NEEDED FOR MUSCLE SPASMS   predniSONE (DELTASONE) 20 MG tablet Take 1 tablet (20 mg total) by mouth daily with breakfast.   Semaglutide, 1 MG/DOSE, 4 MG/3ML SOPN Inject 1 mg as directed once a week.   sucralfate (CARAFATE) 1 g tablet TAKE ONE TABLET BY MOUTH FOUR TIMES A DAY WITH MEALS AND AT BEDTIME   No facility-administered medications prior to visit.    Review of Systems  {Labs  Heme  Chem  Endocrine  Serology  Results Review (optional):23779}   Objective    There were no vitals taken for this visit. {Show previous vital signs  (optional):23777}  Physical Exam  ***  No results found for any visits on 10/09/22.  Assessment & Plan     ***  No follow-ups on file.      {provider attestation***:1}   Jacky Kindle, FNP  [facility] [phone] (phone) [phone] (fax)  [facility]

## 2022-12-04 ENCOUNTER — Other Ambulatory Visit: Payer: Self-pay | Admitting: Family Medicine

## 2022-12-04 DIAGNOSIS — K219 Gastro-esophageal reflux disease without esophagitis: Secondary | ICD-10-CM

## 2022-12-09 ENCOUNTER — Other Ambulatory Visit: Payer: Self-pay | Admitting: Family Medicine

## 2022-12-09 DIAGNOSIS — K219 Gastro-esophageal reflux disease without esophagitis: Secondary | ICD-10-CM

## 2022-12-09 NOTE — Telephone Encounter (Signed)
Patient was a no show on 10/2022

## 2023-02-03 ENCOUNTER — Encounter: Payer: Self-pay | Admitting: Family Medicine

## 2023-04-18 ENCOUNTER — Telehealth: Payer: Medicaid Other | Admitting: Nurse Practitioner

## 2023-04-18 ENCOUNTER — Other Ambulatory Visit: Payer: Self-pay | Admitting: Family Medicine

## 2023-04-18 DIAGNOSIS — K219 Gastro-esophageal reflux disease without esophagitis: Secondary | ICD-10-CM

## 2023-04-18 MED ORDER — SUCRALFATE 1 G PO TABS: ORAL_TABLET | ORAL | 0 refills | Status: DC

## 2023-04-18 NOTE — Progress Notes (Signed)
E-Visit for Heartburn  We are sorry that you are not feeling well.  Here is how we plan to help!  I have refilled your sucralfate for 30 days. You will need to follow up with Ms. Suzie Portela for additional refills.   Gastroesophageal reflux disease (GERD) happens when acid from your stomach flows up into the esophagus.  When acid comes in contact with the esophagus, the acid causes sorenss (inflammation) in the esophagus.  Over time, GERD may create small holes (ulcers) in the lining of the esophagus.   Your symptoms should improve in the next day or two.  You can use antacids as needed until symptoms resolve.  Call us if your heartburn worsens, you have trouble swallowing, weight loss, spitting up blood or recurrent vomiting.  Home Care: May include lifestyle changes such as weight loss, quitting smoking and alcohol consumption Avoid foods and drinks that make your symptoms worse, such as: Caffeine or alcoholic drinks Chocolate Peppermint or mint flavorings Garlic and onions Spicy foods Citrus fruits, such as oranges, lemons, or limes Tomato-based foods such as sauce, chili, salsa and pizza Fried and fatty foods Avoid lying down for 3 hours prior to your bedtime or prior to taking a nap Eat small, frequent meals instead of a large meals Wear loose-fitting clothing.  Do not wear anything tight around your waist that causes pressure on your stomach. Raise the head of your bed 6 to 8 inches with wood blocks to help you sleep.  Extra pillows will not help.  Seek Help Right Away If: You have pain in your arms, neck, jaw, teeth or back Your pain increases or changes in intensity or duration You develop nausea, vomiting or sweating (diaphoresis) You develop shortness of breath or you faint Your vomit is green, yellow, black or looks like coffee grounds or blood Your stool is red, bloody or black  These symptoms could be signs of other problems, such as heart disease, gastric bleeding or  esophageal bleeding.  Make sure you : Understand these instructions. Will watch your condition. Will get help right away if you are not doing well or get worse.  Your e-visit answers were reviewed by a board certified advanced clinical practitioner to complete your personal care plan.  Depending on the condition, your plan could have included both over the counter or prescription medications.  If there is a problem please reply  once you have received a response from your provider.  Your safety is important to [place].  If you have drug allergies check your prescription carefully.    You can use MyChart to ask questions about today's visit, request a non-urgent call back, or ask for a work or school excuse for 24 hours related to this e-Visit. If it has been greater than 24 hours you will need to follow up with your provider, or enter a new e-Visit to address those concerns.  You will get an e-mail in the next two days asking about your experience.  I hope that your e-visit has been valuable and will speed your recovery. Thank you for using e-visits.

## 2023-04-18 NOTE — Progress Notes (Signed)
I have spent 5 minutes in review of e-visit questionnaire, review and updating patient chart, medical decision making and response to patient.  ° °Raahim Shartzer W Kaitlinn Iversen, NP ° °  °

## 2023-05-31 ENCOUNTER — Telehealth: Payer: Medicaid Other | Admitting: Physician Assistant

## 2023-05-31 DIAGNOSIS — K219 Gastro-esophageal reflux disease without esophagitis: Secondary | ICD-10-CM

## 2023-06-01 NOTE — Progress Notes (Signed)
Because this is a chronic issue, I feel your condition warrants further evaluation and I recommend that you be seen for a face to face visit.  Please contact your primary care physician practice to be seen. Many offices offer virtual options to be seen via video if you are not comfortable going in person to a medical facility at this time.  NOTE: You will NOT be charged for this eVisit.  If you do not have a PCP, [place] offers a free physician referral service available at [phone]. Our trained staff has the experience, knowledge and resources to put you in touch with a physician who is right for you.    If you are having a true medical emergency please call 911.   Your e-visit answers were reviewed by a board certified advanced clinical practitioner to complete your personal care plan.  Thank you for using e-Visits.  I have spent 5 minutes in review of e-visit questionnaire, review and updating patient chart, medical decision making and response to patient.   Margaretann Loveless, PA-C

## 2023-06-05 ENCOUNTER — Encounter: Payer: Self-pay | Admitting: Family Medicine

## 2023-06-05 ENCOUNTER — Ambulatory Visit (INDEPENDENT_AMBULATORY_CARE_PROVIDER_SITE_OTHER): Payer: Self-pay | Admitting: Family Medicine

## 2023-06-05 DIAGNOSIS — G8929 Other chronic pain: Secondary | ICD-10-CM | POA: Insufficient documentation

## 2023-06-05 DIAGNOSIS — M25531 Pain in right wrist: Secondary | ICD-10-CM

## 2023-06-05 DIAGNOSIS — K2100 Gastro-esophageal reflux disease with esophagitis, without bleeding: Secondary | ICD-10-CM | POA: Insufficient documentation

## 2023-06-05 MED ORDER — SUCRALFATE 1 G PO TABS: 1.0000 g | ORAL_TABLET | Freq: Three times a day (TID) | ORAL | 11 refills | Status: DC

## 2023-06-05 MED ORDER — MELOXICAM 15 MG PO TABS: 15.0000 mg | ORAL_TABLET | Freq: Every day | ORAL | 11 refills | Status: AC

## 2023-06-05 MED ORDER — TIZANIDINE HCL 4 MG PO TABS: 4.0000 mg | ORAL_TABLET | Freq: Four times a day (QID) | ORAL | 0 refills | Status: AC | PRN

## 2023-06-05 MED ORDER — PANTOPRAZOLE SODIUM 40 MG PO TBEC
40.0000 mg | DELAYED_RELEASE_TABLET | Freq: Every day | ORAL | 11 refills | Status: DC
Start: 2023-06-05 — End: 2024-06-02

## 2023-06-05 NOTE — Assessment & Plan Note (Signed)
Acute on chronic; request for PPI and carafate to assist Will increase from nexium to protonix Will continue carafate Patient is aware of dietary choices affecting GERD symptoms Continues to work on diet/exercise as well as weight mgmt to assist

## 2023-06-05 NOTE — Assessment & Plan Note (Signed)
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

## 2023-06-05 NOTE — Progress Notes (Signed)
I,Vanessa  Vital,acting as a Neurosurgeon for Jacky Kindle, FNP.,have documented all relevant documentation on the behalf of Jacky Kindle, FNP,as directed by  Jacky Kindle, FNP while in the presence of Jacky Kindle, FNP.  Established patient visit   Patient: [name]   DOB: 30-Nov-1991   32 y.o. Female  MRN: [id number] Visit Date: 06/05/2023  Today's healthcare provider: Jacky Kindle, FNP  Re Introduced to nurse practitioner role and practice setting.  All questions answered.  Discussed provider/patient relationship and expectations.   Subjective    HPI  Patient is here today for a refill on her sucralfate, states that her heartburn has been bad recently and would like to get a refill.  Medications: Outpatient Medications Prior to Visit  Medication Sig   [DISCONTINUED] esomeprazole (NEXIUM) 20 MG capsule Take 20 mg by mouth daily at 12 noon.   [DISCONTINUED] sucralfate (CARAFATE) 1 g tablet TAKE ONE TABLET BY MOUTH FOUR TIMES A DAY WITH MEALS AND AT BEDTIME   traMADol (ULTRAM) 50 MG tablet Take 50 mg by mouth every 6 (six) hours as needed for moderate pain.   [DISCONTINUED] albuterol (VENTOLIN HFA) 108 (90 Base) MCG/ACT inhaler Inhale 2 puffs into the lungs every 6 (six) hours as needed for wheezing or shortness of breath. (Patient not taking: Reported on 06/05/2023)   [DISCONTINUED] aluminum chloride (DRYSOL) 20 % external solution Apply topically at bedtime. (Patient not taking: Reported on 06/05/2023)   [DISCONTINUED] clobetasol (TEMOVATE) 0.05 % external solution Apply 1 application topically 2 (two) times daily. (Patient not taking: Reported on 06/05/2023)   [DISCONTINUED] ketoconazole (NIZORAL) 2 % shampoo Apply topically 2 (two) times a week. (Patient not taking: Reported on 06/05/2023)   [DISCONTINUED] meloxicam (MOBIC) 15 MG tablet TAKE ONE TABLET BY MOUTH DAILY (Patient not taking: Reported on 06/05/2023)   [DISCONTINUED] metaxalone (SKELAXIN) 800 MG tablet TAKE ONE TABLET BY  MOUTH THREE TIMES A DAY AS NEEDED FOR MUSCLE SPASMS (Patient not taking: Reported on 06/05/2023)   [DISCONTINUED] predniSONE (DELTASONE) 20 MG tablet Take 1 tablet (20 mg total) by mouth daily with breakfast. (Patient not taking: Reported on 06/05/2023)   [DISCONTINUED] Semaglutide, 1 MG/DOSE, 4 MG/3ML SOPN Inject 1 mg as directed once a week.   No facility-administered medications prior to visit.    Review of Systems    Objective    BP (!) 140/105 (BP Location: Left Arm, Patient Position: Sitting, Cuff Size: Large)   Pulse 84   Temp 98.8 F (37.1 C) (Oral)   Resp 14   Ht 5\' 5"  (1.651 m)   Wt 254 lb 14.4 oz (115.6 kg)   SpO2 99%   BMI 42.42 kg/m   Physical Exam Vitals and nursing note reviewed.  Constitutional:      General: She is not in acute distress.    Appearance: Normal appearance. She is obese. She is not ill-appearing, toxic-appearing or diaphoretic.  HENT:     Head: Normocephalic and atraumatic.  Cardiovascular:     Rate and Rhythm: Normal rate and regular rhythm.     Pulses: Normal pulses.     Heart sounds: Normal heart sounds. No murmur heard.    No friction rub. No gallop.  Pulmonary:     Effort: Pulmonary effort is normal. No respiratory distress.     Breath sounds: Normal breath sounds. No stridor. No wheezing, rhonchi or rales.  Chest:     Chest wall: No tenderness.  Musculoskeletal:  General: Swelling and tenderness present. No deformity or signs of injury. Normal range of motion.     Right lower leg: No edema.     Left lower leg: No edema.     Comments: R wrist pain with edema; likely CTS  Skin:    General: Skin is warm and dry.     Capillary Refill: Capillary refill takes less than 2 seconds.     Coloration: Skin is not jaundiced or pale.     Findings: No bruising, erythema, lesion or rash.  Neurological:     General: No focal deficit present.     Mental Status: She is alert and oriented to person, place, and time. Mental status is at baseline.      Cranial Nerves: No cranial nerve deficit.     Sensory: No sensory deficit.     Motor: No weakness.     Coordination: Coordination normal.  Psychiatric:        Mood and Affect: Mood normal.        Behavior: Behavior normal.        Thought Content: Thought content normal.        Judgment: Judgment normal.     No results found for any visits on 06/05/23.  Assessment & Plan     Problem List Items Addressed This Visit       Digestive   Gastroesophageal reflux disease with esophagitis without hemorrhage    Acute on chronic; request for PPI and carafate to assist Will increase from nexium to protonix Will continue carafate Patient is aware of dietary choices affecting GERD symptoms Continues to work on diet/exercise as well as weight mgmt to assist        Other   Chronic pain of right wrist    Chronic, likely CTS given overuse given computer work Request for muscle relaxant for flares and Mobic refill Continue to be mindful of Mobic with GERD; recommend 1/2 dose if able and always with food Use muscle relaxant with flares; use caution with driving or EtOH      Relevant Medications   traMADol (ULTRAM) 50 MG tablet   tiZANidine (ZANAFLEX) 4 MG tablet   meloxicam (MOBIC) 15 MG tablet   Morbid obesity (HCC) - Primary    Chronic Continue to recommend balanced, lower carb meals. Smaller meal size, adding snacks. Choosing water as drink of choice and increasing purposeful exercise. Body mass index is 42.42 kg/m.        Return in about 6 months (around 12/06/2023) for annual examination.     Leilani Merl, FNP, have reviewed all documentation for this visit. The documentation on 06/05/23 for the exam, diagnosis, procedures, and orders are all accurate and complete.  Jacky Kindle, FNP  [facility] [phone] (phone) [phone] (fax)  [facility]

## 2023-06-05 NOTE — Assessment & Plan Note (Signed)
Chronic Continue to recommend balanced, lower carb meals. Smaller meal size, adding snacks. Choosing water as drink of choice and increasing purposeful exercise. Body mass index is 42.42 kg/m.

## 2023-06-21 ENCOUNTER — Encounter: Payer: Self-pay | Admitting: Family Medicine

## 2023-06-21 ENCOUNTER — Telehealth: Payer: Self-pay | Admitting: Physician Assistant

## 2023-06-21 DIAGNOSIS — N3944 Nocturnal enuresis: Secondary | ICD-10-CM

## 2023-06-21 DIAGNOSIS — R0681 Apnea, not elsewhere classified: Secondary | ICD-10-CM

## 2023-06-21 DIAGNOSIS — R351 Nocturia: Secondary | ICD-10-CM

## 2023-06-21 DIAGNOSIS — R0683 Snoring: Secondary | ICD-10-CM

## 2023-06-21 NOTE — Progress Notes (Signed)
Because of having a chronic issue of nocturia and nocturnal incontinence with concerns for sleep apnea, I feel your condition warrants further evaluation and I recommend that you be seen for a face to face visit.  Please contact your primary care physician practice to be seen. Many offices offer virtual options to be seen via video if you are not comfortable going in person to a medical facility at this time.   We are unable to treat chronic medical issues that have not been fully evaluated through the virtual urgent care. There is potential for sleep apnea that needs to be evaluated with a sleep study to confirm. Also, the nocturnal incontinence may be some overactive bladder issues that also should be evaluated for in-person, possibly even requiring a referral to a Urologist due to your young age to evaluate for sources and /or causes of this issue.  NOTE: You will NOT be charged for this eVisit.  If you do not have a PCP, [facility] offers a free physician referral service available at [phone]. Our trained staff has the experience, knowledge and resources to put you in touch with a physician who is right for you.    If you are having a true medical emergency please call 911.   Your e-visit answers were reviewed by a board certified advanced clinical practitioner to complete your personal care plan.  Thank you for using e-Visits.  I have spent 5 minutes in review of e-visit questionnaire, review and updating patient chart, medical decision making and response to patient.   Margaretann Loveless, PA-C

## 2023-08-04 ENCOUNTER — Telehealth: Payer: Self-pay | Admitting: Family Medicine

## 2023-08-04 NOTE — Telephone Encounter (Signed)
Medication Refill - Medication: pantoprazole (PROTONIX) 40 MG tablet [[id number]]   sucralfate (CARAFATE) 1 g tablet [[id number]]    Has the patient contacted their pharmacy? Yes.   (Agent: If no, request that the patient contact the pharmacy for the refill. If patient does not wish to contact the pharmacy document the reason why and proceed with request.) (Agent: If yes, when and what did the pharmacy advise?)  Preferred Pharmacy (with phone number or street name):  [id number] [place], [place] - [address] Phone: [phone]  Fax: [phone]     Has the patient been seen for an appointment in the last year OR does the patient have an upcoming appointment? Yes.    Agent: Please be advised that RX refills may take up to 3 business days. We ask that you follow-up with your pharmacy.

## 2023-08-04 NOTE — Telephone Encounter (Signed)
[facility] called and spoke to [place], [facility] about the refill(s) pantoprazole and sucralfate requested. Advised they both were sent on 06/05/23. She says the patient picked up a 90 day supply of pantoprazole and should have 30 days left, sucralfate is available to refill and will be ready today. Patient called to advised about pantoprazole, left VM to return the call to the office.

## 2023-09-14 ENCOUNTER — Ambulatory Visit
Admission: EM | Admit: 2023-09-14 | Discharge: 2023-09-14 | Disposition: A | Discharge status: Home / Self Care | Payer: Medicaid Other | Attending: Physician Assistant | Admitting: Physician Assistant

## 2023-09-14 DIAGNOSIS — N898 Other specified noninflammatory disorders of vagina: Secondary | ICD-10-CM | POA: Insufficient documentation

## 2023-09-14 DIAGNOSIS — K6289 Other specified diseases of anus and rectum: Secondary | ICD-10-CM | POA: Diagnosis not present

## 2023-09-14 LAB — POCT URINALYSIS DIP (MANUAL ENTRY)
Bilirubin, UA: NEGATIVE
Blood, UA: NEGATIVE
Glucose, UA: NEGATIVE mg/dL
Ketones, POC UA: NEGATIVE mg/dL
Leukocytes, UA: NEGATIVE
Nitrite, UA: NEGATIVE
Protein Ur, POC: NEGATIVE mg/dL
Spec Grav, UA: 1.02 (ref 1.010–1.025)
Urobilinogen, UA: 0.2 U/dL
pH, UA: 6 (ref 5.0–8.0)

## 2023-09-14 MED ORDER — HYDROCORTISONE (PERIANAL) 2.5 % EX CREA: 1.0000 | TOPICAL_CREAM | Freq: Two times a day (BID) | CUTANEOUS | 1 refills | Status: AC

## 2023-09-14 MED ORDER — METRONIDAZOLE 500 MG PO TABS: 500.0000 mg | ORAL_TABLET | Freq: Two times a day (BID) | ORAL | 0 refills | Status: AC

## 2023-09-14 NOTE — ED Provider Notes (Signed)
[name]    CSN: [id number] Arrival date & time: 09/14/23  1600      History   Chief Complaint Chief Complaint  Patient presents with   Vaginal Discharge    HPI [name] is a 32 y.o. female.   Patient presents today with a several month history of a variety of symptoms.  She reports associated vaginal discharge, itching, irritation, odor.  She has had vaginal yeast infections and bacterial vaginosis multiple times in the past but has not been tested or treated recently.  She also reports some itching and irritation around her anus which she attributes to hemorrhoids.  She has been cleaning this area with flushable wipes as well as a bidet but still feels she has difficulty getting herself clean.  She denies any recent antibiotics or medication changes.  Denies history of diabetes and does not take an SGLT2 inhibitor.  Denies any abdominal pain, pelvic pain, fever, nausea, vomiting.  She has no concern for pregnancy as she is on an IUD.  She has not tried any over-the-counter medications for symptom management.  She hasa concern for STI but is open to testing.    Past Medical History:  Diagnosis Date   Anxiety    Heartburn     Patient Active Problem List   Diagnosis Date Noted   Morbid obesity (HCC) 06/05/2023   Gastroesophageal reflux disease with esophagitis without hemorrhage 06/05/2023   Chronic pain of right wrist 06/05/2023    Past Surgical History:  Procedure Laterality Date   WISDOM TOOTH EXTRACTION      OB History     Gravida  2   Para  2   Term  2   Preterm      AB      Living  2      SAB      IAB      Ectopic      Multiple  0   Live Births  2            Home Medications    Prior to Admission medications   Medication Sig Start Date End Date Taking? Authorizing Provider  hydrocortisone (ANUSOL-HC) 2.5 % rectal cream Place 1 Application rectally 2 (two) times daily. 09/14/23  Yes Ajayla Iglesias K, PA-C   metroNIDAZOLE (FLAGYL) 500 MG tablet Take 1 tablet (500 mg total) by mouth 2 (two) times daily. 09/14/23  Yes Randeep Biondolillo, Noberto Retort, PA-C  meloxicam (MOBIC) 15 MG tablet Take 1 tablet (15 mg total) by mouth daily. 06/05/23   Jacky Kindle, FNP  pantoprazole (PROTONIX) 40 MG tablet Take 1 tablet (40 mg total) by mouth daily. 06/05/23   Jacky Kindle, FNP  sucralfate (CARAFATE) 1 g tablet Take 1 tablet (1 g total) by mouth 4 (four) times daily -  with meals and at bedtime. 06/05/23   Jacky Kindle, FNP  tiZANidine (ZANAFLEX) 4 MG tablet Take 1 tablet (4 mg total) by mouth every 6 (six) hours as needed for muscle spasms. 06/05/23   Jacky Kindle, FNP  traMADol (ULTRAM) 50 MG tablet Take 50 mg by mouth every 6 (six) hours as needed for moderate pain.    [provider]    Family History Family History  Problem Relation Age of Onset   Cancer Mother        SKIN CANCER   Hypertension Father    Cancer Father        PROSTATE CANCER   Seizures Brother  Cancer Maternal Grandmother        BRAIN TUMOR   Breast cancer Neg Hx    Ovarian cancer Neg Hx    Colon cancer Neg Hx     Social History Social History   Tobacco Use   Smoking status: Former    Current packs/day: 0.00    Types: Cigarettes    Quit date: 11/24/2014    Years since quitting: 8.8   Smokeless tobacco: Never  Vaping Use   Vaping status: Never Used  Substance Use Topics   Alcohol use: No   Drug use: No     Allergies   Patient has no known allergies.   Review of Systems Review of Systems  Constitutional:  Positive for activity change. Negative for appetite change, fatigue and fever.  Gastrointestinal:  Negative for abdominal pain, diarrhea, nausea and vomiting.  Genitourinary:  Positive for frequency, vaginal discharge and vaginal pain (itching and irritation). Negative for dysuria, genital sores, menstrual problem, pelvic pain, urgency and vaginal bleeding.  Musculoskeletal:  Negative for arthralgias and myalgias.      Physical Exam Triage Vital Signs ED Triage Vitals  Encounter Vitals Group     BP 09/14/23 1747 134/88     Systolic BP Percentile --      Diastolic BP Percentile --      Pulse Rate 09/14/23 1747 84     Resp 09/14/23 1747 18     Temp 09/14/23 1747 98.2 F (36.8 C)     Temp src --      SpO2 09/14/23 1747 98 %     Weight --      Height --      Head Circumference --      Peak Flow --      Pain Score 09/14/23 1744 0     Pain Loc --      Pain Education --      Exclude from Growth Chart --    No data found.  Updated Vital Signs BP 134/88   Pulse 84   Temp 98.2 F (36.8 C)   Resp 18   SpO2 98%   Visual Acuity Right Eye Distance:   Left Eye Distance:   Bilateral Distance:    Right Eye Near:   Left Eye Near:    Bilateral Near:     Physical Exam Vitals reviewed. Exam conducted with a chaperone present.  Constitutional:      General: She is awake. She is not in acute distress.    Appearance: Normal appearance. She is well-developed. She is not ill-appearing.     Comments: Very pleasant female appears stated age in no acute distress sitting comfortably in exam room  HENT:     Head: Normocephalic and atraumatic.  Cardiovascular:     Rate and Rhythm: Normal rate and regular rhythm.     Heart sounds: Normal heart sounds, S1 normal and S2 normal. No murmur heard. Pulmonary:     Effort: Pulmonary effort is normal.     Breath sounds: Normal breath sounds. No wheezing, rhonchi or rales.     Comments: Clear to auscultation bilaterally Abdominal:     General: Bowel sounds are normal.     Palpations: Abdomen is soft.     Tenderness: There is no abdominal tenderness. There is no right CVA tenderness, left CVA tenderness, guarding or rebound.     Comments: Benign abdominal exam  Genitourinary:    General: Normal vulva.     Labia:  Right: No rash or tenderness.        Left: No rash or tenderness.      Vagina: Vaginal discharge present.     Cervix: Normal.      Uterus: Normal.      Adnexa: Right adnexa normal and left adnexa normal.     Rectum: No external hemorrhoid or internal hemorrhoid.     Comments: Katie, RN present as chaperone during exam.  Thick discharge noted posterior vaginal vault.  No tenderness on bimanual exam.  Area surrounding rectum is erythematous and irritated.  No hemorrhoid noted. Psychiatric:        Behavior: Behavior is cooperative.      UC Treatments / Results  Labs (all labs ordered are listed, but only abnormal results are displayed) Labs Reviewed  POCT URINALYSIS DIP (MANUAL ENTRY)  CERVICOVAGINAL ANCILLARY ONLY    EKG   Radiology No results found.  Procedures Procedures (including critical care time)  Medications Ordered in UC Medications - No data to display  Initial Impression / Assessment and Plan / UC Course  I have reviewed the triage vital signs and the nursing notes.  Pertinent labs & imaging results that were available during my care of the patient were reviewed by me and considered in my medical decision making (see chart for details).     Patient is well-appearing, afebrile, nontoxic, nontachycardic.  Vital signs of physical exam are reassuring.  Urine was normal with no evidence of infection.  No evidence of PID based on pelvic exam.  Concern for bacterial vaginosis given clinical presentation.  Will treat with metronidazole and she was instructed to avoid alcohol while on this medication and for 3 days after finishing the course to avoid Antabuse side effects.  STI swab was collected and is pending.  We will contact her if any to arrange additional treatment.  Discussed that if anything worsens or changes she is to return for reevaluation.  Patient had significant erythema and irritation of skin surrounding her rectum.  I suspect this is related to either an allergic reaction to the wipes she is using or from over cleaning.  Recommended that she use hypoallergenic soaps and detergents and  avoid over cleaning the area.  She is to apply hydrocortisone cream to help with healing.  We discussed that if her symptoms are not improving or if anything worsen she should return for reevaluation.  Strict return precautions given.  All questions answered to patient satisfaction.  Final Clinical Impressions(s) / UC Diagnoses   Final diagnoses:  Vaginal odor  Vaginal discharge  Rectal irritation     Discharge Instructions      Your urine was normal.  We are treating you for bacterial vaginosis.  Please start metronidazole twice daily for 7 days.  Do not drink any alcohol while on this medication and for 3 days after you finish the medicine.  We will contact you if we need to arrange any additional treatment.  Wear loosefitting cotton underwear and use hypoallergenic soaps and detergents.  The area around your rectum is irritated.  Please apply hydrocortisone cream twice daily.  Use hypoallergenic soaps and detergents.  Avoid over cleaning the area.  If this is not improving or if anything worsens please return for reevaluation.     ED Prescriptions     Medication Sig Dispense Auth. Provider   metroNIDAZOLE (FLAGYL) 500 MG tablet Take 1 tablet (500 mg total) by mouth 2 (two) times daily. 14 tablet Taher Vannote, Noberto Retort, PA-C  hydrocortisone (ANUSOL-HC) 2.5 % rectal cream Place 1 Application rectally 2 (two) times daily. 30 g Lachae Hohler, Noberto Retort, PA-C      PDMP not reviewed this encounter.   Jeani Hawking, PA-C 09/14/23 1810

## 2023-09-14 NOTE — Discharge Instructions (Signed)
Your urine was normal.  We are treating you for bacterial vaginosis.  Please start metronidazole twice daily for 7 days.  Do not drink any alcohol while on this medication and for 3 days after you finish the medicine.  We will contact you if we need to arrange any additional treatment.  Wear loosefitting cotton underwear and use hypoallergenic soaps and detergents.  The area around your rectum is irritated.  Please apply hydrocortisone cream twice daily.  Use hypoallergenic soaps and detergents.  Avoid over cleaning the area.  If this is not improving or if anything worsens please return for reevaluation.

## 2023-09-14 NOTE — ED Triage Notes (Signed)
Patient to Urgent Care with complaints of vaginal discharge/ itching/ foul odor. Denies any UTI symptoms.  Has been using hemorrhoid creams/ spray. Reports she is also having anal itching/ irritation. States that she has a tendency to "over clean" and attributes symptoms to this.  Symptoms x6 months.

## 2023-09-15 LAB — CERVICOVAGINAL ANCILLARY ONLY
Bacterial Vaginitis (gardnerella): POSITIVE — AB
Candida Glabrata: NEGATIVE
Candida Vaginitis: NEGATIVE
Chlamydia: NEGATIVE
Comment: NEGATIVE
Comment: NEGATIVE
Comment: NEGATIVE
Comment: NEGATIVE
Comment: NEGATIVE
Comment: NORMAL
Neisseria Gonorrhea: NEGATIVE
Trichomonas: NEGATIVE

## 2023-11-22 ENCOUNTER — Other Ambulatory Visit: Payer: Self-pay | Admitting: Family Medicine

## 2023-11-23 ENCOUNTER — Telehealth: Payer: Self-pay | Admitting: Family Medicine

## 2023-11-23 NOTE — Telephone Encounter (Signed)
Please advise 

## 2023-11-23 NOTE — Telephone Encounter (Signed)
Received fax from [facility] asking for refills on Tizanidine HCL 4 mg. #30

## 2024-04-08 DIAGNOSIS — J028 Acute pharyngitis due to other specified organisms: Secondary | ICD-10-CM | POA: Diagnosis not present

## 2024-04-08 DIAGNOSIS — B3731 Acute candidiasis of vulva and vagina: Secondary | ICD-10-CM | POA: Diagnosis not present

## 2024-04-08 DIAGNOSIS — J019 Acute sinusitis, unspecified: Secondary | ICD-10-CM | POA: Diagnosis not present

## 2024-04-08 DIAGNOSIS — J209 Acute bronchitis, unspecified: Secondary | ICD-10-CM | POA: Diagnosis not present

## 2024-04-08 DIAGNOSIS — Z03818 Encounter for observation for suspected exposure to other biological agents ruled out: Secondary | ICD-10-CM | POA: Diagnosis not present

## 2024-04-08 DIAGNOSIS — H66002 Acute suppurative otitis media without spontaneous rupture of ear drum, left ear: Secondary | ICD-10-CM | POA: Diagnosis not present

## 2024-04-08 DIAGNOSIS — B9689 Other specified bacterial agents as the cause of diseases classified elsewhere: Secondary | ICD-10-CM | POA: Diagnosis not present

## 2024-05-30 ENCOUNTER — Other Ambulatory Visit: Payer: Self-pay

## 2024-05-30 ENCOUNTER — Telehealth: Payer: Self-pay | Admitting: Family Medicine

## 2024-05-30 NOTE — Telephone Encounter (Signed)
 [name] faxed refill request for the following medications:   pantoprazole  (PROTONIX ) 40 MG tablet   Please advise

## 2024-06-01 ENCOUNTER — Other Ambulatory Visit: Payer: Self-pay

## 2024-06-01 NOTE — Telephone Encounter (Signed)
 Received another fax from [facility] requesting this medication.  Please advise

## 2024-06-02 ENCOUNTER — Other Ambulatory Visit: Payer: Self-pay

## 2024-06-02 MED ORDER — PANTOPRAZOLE SODIUM 40 MG PO TBEC
40.0000 mg | DELAYED_RELEASE_TABLET | Freq: Every day | ORAL | 0 refills | Status: DC
Start: 2024-06-02 — End: 2024-06-22

## 2024-06-22 ENCOUNTER — Other Ambulatory Visit: Payer: Self-pay | Admitting: Family Medicine

## 2024-06-22 DIAGNOSIS — K2100 Gastro-esophageal reflux disease with esophagitis, without bleeding: Secondary | ICD-10-CM

## 2024-06-22 MED ORDER — SUCRALFATE 1 G PO TABS: 1.0000 g | ORAL_TABLET | Freq: Three times a day (TID) | ORAL | 0 refills | Status: DC

## 2024-06-22 MED ORDER — PANTOPRAZOLE SODIUM 40 MG PO TBEC: 40.0000 mg | DELAYED_RELEASE_TABLET | Freq: Every day | ORAL | 0 refills | Status: DC

## 2024-06-22 NOTE — Telephone Encounter (Signed)
 [name] faxed refill request for the following medications:   sucralfate  (CARAFATE ) 1 g tablet   Please advise

## 2024-10-04 ENCOUNTER — Telehealth: Payer: Self-pay | Admitting: Physician Assistant

## 2024-10-04 DIAGNOSIS — Z760 Encounter for issue of repeat prescription: Secondary | ICD-10-CM

## 2024-10-04 DIAGNOSIS — R12 Heartburn: Secondary | ICD-10-CM

## 2024-10-05 ENCOUNTER — Telehealth: Payer: Self-pay | Admitting: Physician Assistant

## 2024-10-05 DIAGNOSIS — K2100 Gastro-esophageal reflux disease with esophagitis, without bleeding: Secondary | ICD-10-CM

## 2024-10-05 MED ORDER — PANTOPRAZOLE SODIUM 40 MG PO TBEC: 40.0000 mg | DELAYED_RELEASE_TABLET | Freq: Every day | ORAL | 0 refills | Status: AC

## 2024-10-05 MED ORDER — SUCRALFATE 1 G PO TABS: 1.0000 g | ORAL_TABLET | Freq: Three times a day (TID) | ORAL | 0 refills | Status: AC

## 2024-10-05 NOTE — Progress Notes (Signed)
 Virtual Visit Consent   [name], you are scheduled for a virtual visit with a [facility] provider today. Just as with appointments in the office, your consent must be obtained to participate. Your consent will be active for this visit and any virtual visit you may have with one of our providers in the next 365 days. If you have a MyChart account, a copy of this consent can be sent to you electronically.  As this is a virtual visit, video technology does not allow for your provider to perform a traditional examination. This may limit your provider's ability to fully assess your condition. If your provider identifies any concerns that need to be evaluated in person or the need to arrange testing (such as labs, EKG, etc.), we will make arrangements to do so. Although advances in technology are sophisticated, we cannot ensure that it will always work on either your end or our end. If the connection with a video visit is poor, the visit may have to be switched to a telephone visit. With either a video or telephone visit, we are not always able to ensure that we have a secure connection.  By engaging in this virtual visit, you consent to the provision of healthcare and authorize for your insurance to be billed (if applicable) for the services provided during this visit. Depending on your insurance coverage, you may receive a charge related to this service.  I need to obtain your verbal consent now. Are you willing to proceed with your visit today? [name] has provided verbal consent on 10/05/2024 for a virtual visit (video or telephone). Delon [name], PA-C  Date: 10/05/2024 4:16 PM   Virtual Visit via Video Note   I, Delon [name], connected with  [name]  ([id number], December 02, 1990) on 10/05/24 at  4:00 PM EST by a video-enabled telemedicine application and verified that I am speaking with the correct person using two identifiers.  Location: Patient: Virtual  Visit Location Patient: Home Provider: Virtual Visit Location Provider: Home Office   I discussed the limitations of evaluation and management by telemedicine and the availability of in person appointments. The patient expressed understanding and agreed to proceed.    History of Present Illness: [name] is a 33 y.o. who identifies as a female who was assigned female at birth, and is being seen today for medication refills. Patient has chronic GERD but has been stable on Sucralfate  1g ACHS and Pantoprazole  40mg  daily. Previous PCP left and has not re-established with a new provider.    Problems:  Patient Active Problem List   Diagnosis Date Noted   Morbid obesity (HCC) 06/05/2023   Gastroesophageal reflux disease with esophagitis without hemorrhage 06/05/2023   Chronic pain of right wrist 06/05/2023    Allergies: No Known Allergies Medications:  Current Outpatient Medications:    hydrocortisone  (ANUSOL -HC) 2.5 % rectal cream, Place 1 Application rectally 2 (two) times daily., Disp: 30 g, Rfl: 1   meloxicam  (MOBIC ) 15 MG tablet, Take 1 tablet (15 mg total) by mouth daily., Disp: 30 tablet, Rfl: 11   metroNIDAZOLE  (FLAGYL ) 500 MG tablet, Take 1 tablet (500 mg total) by mouth 2 (two) times daily., Disp: 14 tablet, Rfl: 0   pantoprazole  (PROTONIX ) 40 MG tablet, Take 1 tablet (40 mg total) by mouth daily., Disp: 90 tablet, Rfl: 0   sucralfate  (CARAFATE ) 1 g tablet, Take 1 tablet (1 g total) by mouth 4 (four) times daily -  with meals and at bedtime., Disp: 360  tablet, Rfl: 0   tiZANidine  (ZANAFLEX ) 4 MG tablet, Take 1 tablet (4 mg total) by mouth every 6 (six) hours as needed for muscle spasms., Disp: 30 tablet, Rfl: 0   traMADol (ULTRAM) 50 MG tablet, Take 50 mg by mouth every 6 (six) hours as needed for moderate pain., Disp: , Rfl:   Observations/Objective: Patient is well-developed, well-nourished in no acute distress.  Resting comfortably at home.  Head is normocephalic,  atraumatic.  No labored breathing.  Speech is clear and coherent with logical content.  Patient is alert and oriented at baseline.    Assessment and Plan: 1. Gastroesophageal reflux disease with esophagitis without hemorrhage - pantoprazole  (PROTONIX ) 40 MG tablet; Take 1 tablet (40 mg total) by mouth daily.  Dispense: 90 tablet; Refill: 0 - sucralfate  (CARAFATE ) 1 g tablet; Take 1 tablet (1 g total) by mouth 4 (four) times daily -  with meals and at bedtime.  Dispense: 360 tablet; Refill: 0  - Advised we would refill once x 90 days for her to have time to get re-established with her previous PCP office - Would need to be seen in person for any future refills  Follow Up Instructions: I discussed the assessment and treatment plan with the patient. The patient was provided an opportunity to ask questions and all were answered. The patient agreed with the plan and demonstrated an understanding of the instructions.  A copy of instructions were sent to the patient via MyChart unless otherwise noted below.    The patient was advised to call back or seek an in-person evaluation if the symptoms worsen or if the condition fails to improve as anticipated.    Delon [name], PA-C

## 2024-10-05 NOTE — Progress Notes (Signed)
   Thank you for the details you included in the comment boxes. Those details are very helpful in determining the best course of treatment for you and help us  to provide the best care. Because of needing a chronic medication refill, we recommend that you schedule a Virtual Urgent Care video visit in order for the provider to better assess what is going on.  The provider will be able to give you a more accurate diagnosis and treatment plan if we can more freely discuss your symptoms and with the addition of a virtual examination.   If you change your visit to a video visit, we will bill your insurance (similar to an office visit) and you will not be charged for this e-Visit. You will be able to stay at home and speak with the first available [facility] advanced practice provider. The link to do a video visit is in the drop down Menu tab of your Welcome screen in MyChart.

## 2024-10-05 NOTE — Patient Instructions (Signed)
 [name], thank you for joining Marcia [name], PA-C for today's virtual visit.  While this provider is not your primary care provider (PCP), if your PCP is located in our provider database this encounter information will be shared with them immediately following your visit.   A [facility] MyChart account gives you access to today's visit and all your visits, tests, and labs performed at [facility]  click here if you don't have a [facility] MyChart account or go to [email or web address]  Consent: (Patient) [name] provided verbal consent for this virtual visit at the beginning of the encounter.  Current Medications:  Current Outpatient Medications:    hydrocortisone  (ANUSOL -HC) 2.5 % rectal cream, Place 1 Application rectally 2 (two) times daily., Disp: 30 g, Rfl: 1   meloxicam  (MOBIC ) 15 MG tablet, Take 1 tablet (15 mg total) by mouth daily., Disp: 30 tablet, Rfl: 11   metroNIDAZOLE  (FLAGYL ) 500 MG tablet, Take 1 tablet (500 mg total) by mouth 2 (two) times daily., Disp: 14 tablet, Rfl: 0   pantoprazole  (PROTONIX ) 40 MG tablet, Take 1 tablet (40 mg total) by mouth daily., Disp: 90 tablet, Rfl: 0   sucralfate  (CARAFATE ) 1 g tablet, Take 1 tablet (1 g total) by mouth 4 (four) times daily -  with meals and at bedtime., Disp: 360 tablet, Rfl: 0   tiZANidine  (ZANAFLEX ) 4 MG tablet, Take 1 tablet (4 mg total) by mouth every 6 (six) hours as needed for muscle spasms., Disp: 30 tablet, Rfl: 0   traMADol (ULTRAM) 50 MG tablet, Take 50 mg by mouth every 6 (six) hours as needed for moderate pain., Disp: , Rfl:    Medications ordered in this encounter:  Meds ordered this encounter  Medications   pantoprazole  (PROTONIX ) 40 MG tablet    Sig: Take 1 tablet (40 mg total) by mouth daily.    Dispense:  90 tablet    Refill:  0    Supervising Provider:   LAMPTEY, PHILIP Morris [[id number]]   sucralfate  (CARAFATE ) 1 g tablet    Sig: Take 1 tablet (1 g total) by mouth  4 (four) times daily -  with meals and at bedtime.    Dispense:  360 tablet    Refill:  0    Supervising Provider:   BLAISE ALEENE Morris [[id number]]     *If you need refills on other medications prior to your next appointment, please contact your pharmacy*  Follow-Up: Call back or seek an in-person evaluation if the symptoms worsen or if the condition fails to improve as anticipated.  [facility] [phone]  Other Instructions GERD in Adults: Diet Changes When you have gastroesophageal reflux disease (GERD), you may need to make changes to your diet. Choosing the right foods can help with your symptoms. Think about working with an expert in healthy eating called a dietitian. They can help you make healthy food choices. What are tips for following this plan? Reading food labels Look for foods that are low in saturated fat. Foods that may help with your symptoms include: Foods with less than 5% of daily value (DV) of fat. Foods with 0 grams of trans fat. Cooking [employer] in ways that don't use a lot of fat. These ways include: Baking. Steaming. Grilling. Broiling. To add flavor, try to use herbs that are low in spice and acidity. Avoid frying your food. Meal planning  Eat small meals often rather than eating 3 large meals each day. Eat your meals  slowly in a place where you feel relaxed. If told by your health care provider, avoid: Foods that cause symptoms. Keep a food diary to keep track of foods that cause symptoms. Alcohol. Drinking a lot of liquid with meals. General instructions For 2-3 hours after you eat, avoid: Bending over. Exercise. Lying down. Chew sugar-free gum after meals. What foods should I eat? Eat a healthy diet. Try to include: Foods with high amounts of fiber. These include: Fruits and vegetables. Whole grains and beans. Low-fat dairy products. Lean meats, fish, and poultry. Egg whites. Foods that cause symptoms in someone else  may not cause symptoms for you. Work with your provider to find foods that are safe for you. The items listed above may not be all the foods and drinks you can have. Talk with a dietitian to learn more. The items listed above may not be a complete list of foods and beverages you can eat and drink. Contact a dietitian for more information. What foods should I avoid? Limiting some of these foods may help with your symptoms. Each person is different. Talk with a dietitian or your provider to help you find the exact foods to avoid. Some of the foods to avoid may include: Fruits Fruits with a lot of acid in them. These may include citrus fruits, such as oranges, grapefruit, pineapple, and lemons. Vegetables Deep-fried vegetables, such as French fries. Vegetables, sauces, or toppings made with added fat and vegetables with acid in them. These may include tomatoes and tomato products, chili peppers, onions, garlic, and horseradish. Grains Pastries or quick breads with added fat. Meats and other proteins High-fat meats, such as fatty beef or pork, hot dogs, ribs, ham, sausage, salami, and bacon. Fried meat or protein, such as fried fish and fried chicken. Egg yolks. Fats and oils Butter. Margarine. Shortening. Ghee. Drinks Coffee and other drinks with caffeine in them. Fizzy and sugary drinks, such as soda and energy drinks. Fruit juice made with acidic fruits, such as orange or grapefruit. Tomato juice. Sweets and desserts Chocolate and cocoa. Donuts. Seasonings and condiments Mint, such as peppermint and spearmint. Condiments, herbs, or seasonings that cause symptoms. These may include curry, hot sauce, or vinegar-based salad dressings. The items listed above may not be all the foods and drinks you should avoid. Talk with a dietitian to learn more. Questions to ask your health care provider Changes to your diet and everyday life are often the first steps taken to manage symptoms of GERD. If these  changes don't help, talk with your provider about taking medicines. Where to find more information [employer]: aboutgerd.org This information is not intended to replace advice given to you by your health care provider. Make sure you discuss any questions you have with your health care provider. Document Revised: 09/29/2023 Document Reviewed: 04/15/2023 Elsevier Patient Education  2024 Elsevier [employer].   If you have been instructed to have an in-person evaluation today at a local Urgent Care facility, please use the link below. It will take you to a list of all of our available [facility], including address, phone number and hours of operation. Please do not delay care.  [facility]  If you or a family member do not have a primary care provider, use the link below to schedule a visit and establish care. When you choose a [facility] primary care physician or advanced practice provider, you gain a long-term partner in health. Find a Primary Care  Provider  Learn more about [facility]'s in-office and virtual care options: [facility] - Get Care Now
